# Patient Record
Sex: Female | Born: 1935 | Race: White | Hispanic: No | Marital: Married | Smoking: Former smoker
Health system: Southern US, Community
[De-identification: ages and names within clinical notes are randomized; demographics above are authoritative.]

## PROBLEM LIST (undated history)

## (undated) DIAGNOSIS — E785 Hyperlipidemia, unspecified: Secondary | ICD-10-CM

## (undated) DIAGNOSIS — C801 Malignant (primary) neoplasm, unspecified: Secondary | ICD-10-CM

## (undated) DIAGNOSIS — I10 Essential (primary) hypertension: Secondary | ICD-10-CM

## (undated) HISTORY — PX: ABDOMINAL HYSTERECTOMY: SHX81

## (undated) HISTORY — PX: CHOLECYSTECTOMY: SHX55

## (undated) HISTORY — DX: Hyperlipidemia, unspecified: E78.5

## (undated) HISTORY — PX: GALLBLADDER SURGERY: SHX652

---

## 1998-10-28 ENCOUNTER — Encounter: Payer: Self-pay | Admitting: Endocrinology

## 1998-10-28 ENCOUNTER — Ambulatory Visit (HOSPITAL_COMMUNITY): Admission: RE | Admit: 1998-10-28 | Discharge: 1998-10-28 | Payer: Self-pay | Admitting: Endocrinology

## 1999-10-06 ENCOUNTER — Other Ambulatory Visit: Admission: RE | Admit: 1999-10-06 | Discharge: 1999-10-06 | Payer: Self-pay | Admitting: Endocrinology

## 2000-01-06 ENCOUNTER — Encounter: Payer: Self-pay | Admitting: Endocrinology

## 2000-01-06 ENCOUNTER — Ambulatory Visit (HOSPITAL_COMMUNITY): Admission: RE | Admit: 2000-01-06 | Discharge: 2000-01-06 | Payer: Self-pay | Admitting: Endocrinology

## 2000-10-28 ENCOUNTER — Other Ambulatory Visit: Admission: RE | Admit: 2000-10-28 | Discharge: 2000-10-28 | Payer: Self-pay | Admitting: Endocrinology

## 2001-01-19 ENCOUNTER — Ambulatory Visit (HOSPITAL_COMMUNITY): Admission: RE | Admit: 2001-01-19 | Discharge: 2001-01-19 | Payer: Self-pay | Admitting: Endocrinology

## 2001-01-19 ENCOUNTER — Encounter: Payer: Self-pay | Admitting: Endocrinology

## 2002-03-02 ENCOUNTER — Encounter: Payer: Self-pay | Admitting: Endocrinology

## 2002-03-02 ENCOUNTER — Ambulatory Visit (HOSPITAL_COMMUNITY): Admission: RE | Admit: 2002-03-02 | Discharge: 2002-03-02 | Payer: Self-pay | Admitting: Endocrinology

## 2003-08-08 ENCOUNTER — Ambulatory Visit (HOSPITAL_COMMUNITY): Admission: RE | Admit: 2003-08-08 | Discharge: 2003-08-08 | Payer: Self-pay | Admitting: Endocrinology

## 2004-09-01 ENCOUNTER — Ambulatory Visit (HOSPITAL_COMMUNITY): Admission: RE | Admit: 2004-09-01 | Discharge: 2004-09-01 | Payer: Self-pay | Admitting: Endocrinology

## 2004-12-12 ENCOUNTER — Ambulatory Visit (HOSPITAL_COMMUNITY): Admission: RE | Admit: 2004-12-12 | Discharge: 2004-12-12 | Payer: Self-pay | Admitting: *Deleted

## 2004-12-12 ENCOUNTER — Encounter (INDEPENDENT_AMBULATORY_CARE_PROVIDER_SITE_OTHER): Payer: Self-pay | Admitting: Specialist

## 2004-12-26 ENCOUNTER — Emergency Department (HOSPITAL_COMMUNITY): Admission: EM | Admit: 2004-12-26 | Discharge: 2004-12-26 | Payer: Self-pay | Admitting: Emergency Medicine

## 2004-12-27 ENCOUNTER — Inpatient Hospital Stay (HOSPITAL_COMMUNITY): Admission: EM | Admit: 2004-12-27 | Discharge: 2004-12-30 | Payer: Self-pay | Admitting: Emergency Medicine

## 2004-12-28 ENCOUNTER — Encounter (INDEPENDENT_AMBULATORY_CARE_PROVIDER_SITE_OTHER): Payer: Self-pay | Admitting: Specialist

## 2004-12-28 ENCOUNTER — Ambulatory Visit: Payer: Self-pay | Admitting: Internal Medicine

## 2005-09-16 ENCOUNTER — Ambulatory Visit (HOSPITAL_COMMUNITY): Admission: RE | Admit: 2005-09-16 | Discharge: 2005-09-16 | Payer: Self-pay | Admitting: Endocrinology

## 2006-09-23 ENCOUNTER — Ambulatory Visit (HOSPITAL_COMMUNITY): Admission: RE | Admit: 2006-09-23 | Discharge: 2006-09-23 | Payer: Self-pay | Admitting: Endocrinology

## 2007-09-26 ENCOUNTER — Ambulatory Visit (HOSPITAL_COMMUNITY): Admission: RE | Admit: 2007-09-26 | Discharge: 2007-09-26 | Payer: Self-pay | Admitting: Endocrinology

## 2007-11-22 ENCOUNTER — Emergency Department (HOSPITAL_COMMUNITY): Admission: EM | Admit: 2007-11-22 | Discharge: 2007-11-22 | Payer: Self-pay | Admitting: Emergency Medicine

## 2008-12-03 ENCOUNTER — Ambulatory Visit (HOSPITAL_COMMUNITY): Admission: RE | Admit: 2008-12-03 | Discharge: 2008-12-03 | Payer: Self-pay | Admitting: Endocrinology

## 2010-04-02 ENCOUNTER — Ambulatory Visit (HOSPITAL_COMMUNITY): Admission: RE | Admit: 2010-04-02 | Discharge: 2010-04-02 | Payer: Self-pay | Admitting: Endocrinology

## 2011-02-06 NOTE — Op Note (Signed)
[name], [name]              ACCOUNT NO.:  [id number]   MEDICAL RECORD NO.:  [id number]          PATIENT TYPE:  INP   LOCATION:  0460                         FACILITY:  [facility]   PHYSICIAN:  Anselm Pancoast. Weatherly, M.D.DATE OF BIRTH:  Sep 23, 1934   DATE OF PROCEDURE:  12/28/2004  DATE OF DISCHARGE:                                 OPERATIVE REPORT   PREOPERATIVE DIAGNOSIS:  Chronic cholecystitis with abnormal liver function  studies.   POSTOPERATIVE DIAGNOSIS:  Chronic cholecystitis with stones and cystic duct  stone.   PROCEDURE:  Laparoscopic cholecystectomy with cholangiogram.   ANESTHESIA:  General.   SURGEON:  Anselm Pancoast. Zachery Dakins, M.D.   ASSISTANT:  Adolph Pollack, M.D.   INDICATIONS FOR PROCEDURE:  [name] is a 76 year old Caucasian female  who was admitted through the emergency room yesterday where she returned  about 24 hours after being seen in the emergency room Friday with epigastric  pain that occurred after eating some potato chips.  She has had some kind of  vague epigastric symptoms and had an upper endoscopy and a colonoscopy by  Dr. Virginia Rochester on the 24th of March.  Dr. Juleen China is her regular physician.  Then,  this pain became tense.  She is on amoxicillin now for positive H pylori.  She was treated with a GI cocktail and released.  The pain, however, came  back not too long afterwards, and she was seen by Dr. Ignacia Palma yesterday.  She had called Dr. Leone Payor, and he had recommended that she come to the  emergency room.  Dr. Ignacia Palma saw her and obtained an ultrasound which  showed a distended gallbladder with numerous stones.  The bile duct was 8  mm.  The liver tests were mildly abnormal, but after receiving a Dilaudid  shot, her pain subsided and she denies having any further pain when I  reexamined her this morning.  I recommend that we proceed on with a  laparoscopic cholecystectomy and cholangiogram, but I wonder if she has a  cystic duct stone that  may or may not have been passed.  The patient is in  agreement with this.   DESCRIPTION OF PROCEDURE:  She was given 3 g of Unasyn and PSA stockings and  taken to the operating suite.  Induction of anesthesia and a tracheal tube  oral tube into the stomach.  She has had a previous hysterectomy but no  upper abdominal surgery.   A small incision was made just below the umbilicus.  We kind of carefully  entered into the peritoneal cavity.  There were some adhesions up here, and  we kind of worked them around.  The upper 10-mm trocar was placed under  direct vision.  She had a very dilated gallbladder with numerous adhesions  around it.  The lateral 5-mm trocars were placed.  We could kind of grasp  the gallbladder, and then we carefully freed the adhesions around it.  The  proximal portion of the gallbladder was dissected free.  The little cystic  artery was identified, doubly clipped proximally, singly distally, and  divided, and then the cystic  duct was just packed with stones.  I placed a  clip on the cystic duct/gallbladder junction and then milked out these  numerous stones in the cystic duct.  We got good flow of bile.  The bile is  under a little extra pressure.  A Cholangiocath was inserted and then a clip  and the x-ray obtained.  The cystic duct was about 1.5 cm in length, and I  did not see any stones in it.  There was, however, a stone in the distal  common bile duct.  You could see the meniscus.  I think it would be best to  let her have an ERCP, so I did not try to do any stone manipulation of the  ampulla but quadrupily clipped the cystic duct and divided it.  The  gallbladder was freed up with electrocautery, with good hemostasis.  It was  placed in an EndoCatch bag.  The irrigation fluid was aspirated where we had  manipulated and had some bile spillage from the common bile duct or cystic  duct opening.  This was irrigated and aspirated and irrigated until  everything was  clear.  The gallbladder within the EndoCatch bag was then  brought out through the umbilicus after switching the camera to the upper 10-  mm port.  We reinserted this on a cannula in the umbilicus and reinspected  for irrigation fluid, bleeding.  Everything looked fine.  We then switched  the camera back up to the upper 10-mm port and withdrew the 5-mm ports.  I  placed an additional figure-of-eight suture in the fascia at the umbilicus  in addition to the 0 pursestring I had placed.  I tied both and anesthetized  the fascia.  Carbon dioxide was released.  The upper 10-mm trocar was  withdrawn, and we then closed the subcutaneous wounds with 4-0 Vicryl.  Benzoin and Steri-Strips were placed on the skin.   I talked with Dr. Leone Payor who actually talked with the patient yesterday  before she came to the emergency room, and he says that Dr. Virginia Rochester is back in  town tomorrow, but he will arrange for Dr. Virginia Rochester to do an ERCP or he will do  an ERCP, but we will wait until Monday.  I think this is certainly  reasonable, and the family was notified of this information.      WJW/MEDQ  D:  12/28/2004  T:  12/28/2004  Job:  [id number]   cc:   Georgiana Spinner, M.D.  [address]  [place]  [place] [address]  Fax: [phone]   Brooke Bonito, M.D.  [address]  [place]  [place] [address]  Fax: [phone]

## 2011-02-06 NOTE — H&P (Signed)
NAMEROGER, FASNACHT              ACCOUNT NO.:  [id number]   MEDICAL RECORD NO.:  [id number]          PATIENT TYPE:  INP   LOCATION:  0460                         FACILITY:  [facility]   PHYSICIAN:  Anselm Pancoast. Weatherly, M.D.DATE OF BIRTH:  November 02, 1934   DATE OF ADMISSION:  12/27/2004  DATE OF DISCHARGE:                                HISTORY & PHYSICAL   CHIEF COMPLAINT:  Epigastric pain secondary to gallstones.   HISTORY:  [name] is a 76 year old Caucasian female, patient of Dr.  Maurine Minister Kohut's, who presented to the emergency room about 24 hours ago with  severe epigastric pain.  She has a history of indigestion and epigastric  pain and was referred to Dr. Virginia Rochester who did an upper endoscopy and a  colonoscopy on I think December 12, 2004.  Her H. pylori was positive, and she  is presently on amoxicillin and the drugs for this.  When she was seen in  the emergency room on December 15, 2004, they gave her a GI cocktail and she  appeared to be improved, and shortly after this she was released.  Now 24  hours, she is having some more severe epigastric pain and she returned to  the emergency room, this time was seen by Dr. Carleene Cooper who sent her for  an ultrasound that confirmed multiple small stones within her gallbladder  and an 8 mm common bile duct.  He suggested that she see a Development worker, international aid.  I was on call and happened to be in the emergency room seeing other patients  at that time.  She said that after Dr. Ignacia Palma gave her the Dilaudid  injection her pain basically subsided and she was a couple of hours of later  at that time, but was not in any significant discomfort at that time.  In  the emergency room, her vital signs:  She had not been febrile, her  temperature was 97.5, pulse 88, respirations 16, blood pressure was 177/87.   PAST MEDICAL HISTORY:  1. History of mild hypertension.  2. Previous hysterectomy.     SOCIAL HISTORY:  She denies alcohol and is a nonsmoker.  She  works about  _______________.   CURRENT MEDICATIONS:  Besides the amoxicillin and the other medications.  1. _____________ 350 mg p.o. daily for blood pressure.  2. Multivitamin daily.     PAST SURGICAL HISTORY:  Abdominal hysterectomy.   REVIEW OF SYSTEMS:  Denies blood pressure problem with the exception of mild  hypotension which she said that Dr. Juleen China has been talking to her about her  cholesterol being elevated and has considered placing her on a medication  for this.  She denies pulmonary problems, denies endocrine problems, she has  a little problem in the past with hemorrhoids, her colonoscopy was otherwise  normal and denies problems with nutrition, sleeping, etc.   PHYSICAL EXAMINATION:  GENERAL:  She is a pleasant, mildly overweight,  Caucasian female who appears her stated age and not in any acute distress at  this time.  Her husband is present during the examination and history  taking.  VITAL SIGNS:  Temperature in the emergency room was 97.5, blood pressure is  177/87.  When  she actually got to the floor, her blood pressure was 153/74.  She is 5 feet 8 inches and weighs 205 pounds.  HEENT:  She is not icteric, well hydrated, no cervical or supraclavicular  lymphadenopathy.  No carotid bruits.  No supraclavicular nodes.  LUNGS:  Clear breath sounds bilaterally.  CARDIAC:  Normal sinus rhythm.  BREASTS:  Unremarkable.  ABDOMEN:  She is not acutely tender.  She has a lower midline incision.  I  do not appreciate any umbilical incision or groin hernias.  I did not do a  rectal examination, she has recently had a colonoscopy that was  unremarkable.  EXTREMITIES:  There is no pedal edema.  Good peripheral pulses.  NEUROLOGIC:  Physiologic.   LABORATORY DATA:  In the emergency room, her urinalysis is negative.  Her  liver function studies are mildly elevated.  Her bilirubin is 2.3, SGOT of  525, SGPT of 462, alkaline phosphatase of 114.  White count is only 5200,   hematocrit is 41.   PLAN:  I am going to plan to admit her, put her on intravenous fluids, and  re-assess her in the morning.  If she is having no further pain, I would  proceed on with an urgent laparoscopic cholecystectomy and cholangiogram,  and I have discussed with her about the possibility of needing an endoscopic  retrograde cholangiopancreatography if there is still stones in the common  bile duct.  Clinically, it appears that she has hopefully passed the stone  since her pain has definitely resolved.  Her common bile duct was only 8 mm  on examination with the ultrasound.  The patient is not allergic to  penicillin and will be given Unasyn on call to the operating room which has  been tentatively scheduled for Sunday morning.      WJW/MEDQ  D:  12/28/2004  T:  12/28/2004  Job:  [id number]

## 2011-02-06 NOTE — Op Note (Signed)
NAMEDECHELLE, [name] NO.:  [id number]   MEDICAL RECORD NO.:  [id number]          PATIENT TYPE:  INP   LOCATION:  0460                         FACILITY:  [facility]   PHYSICIAN:  Georgiana Spinner, M.D.    DATE OF BIRTH:  07/30/1935   DATE OF PROCEDURE:  12/29/2004  DATE OF DISCHARGE:                                 OPERATIVE REPORT   PROCEDURE:  ERCP with stone removal fundus and stent placement.   ANESTHESIA:  Demerol 130, Versed 13 mg, Robinul 0.2 mg and glucagon 1.5 mcg  were given.   DESCRIPTION OF PROCEDURE:  With the patient mildly sedated in the prone  position room 2 of radiology,  the Olympus videoscopic side-viewing  endoscope was inserted in the mouth, passed through the esophagus and the  stomach into the duodenum and was pulled into the shortened position and we  were able to see therefore the ampulla of Vater which was photographed.  There was a fair amount of duodenal spasm which we were partially able to  control with medications.  The loss was easy to enter, however, we injected  contrast on the first pass and outlined a normal pancreatic duct.  Subsequently on the next 2-3 passes with the guidewire continued to go into  the pancreatic duct. Therefore I elected to pass the guidewire into the  pancreatic duct and place a stent which we did. We placed a short stent with  one side being straight without flanges and the external flanges were  placed. Once were satisfied with the placement of the stent, pancreatic  guidewire was removed and we reintroduced the tritome catheter with the  guidewire. We were then able to cannulate the os and get the guide wire to  go to the common bile duct. There was some resistance to passage of the  tritome over the pancreatic stent but we were able to get this to go  subsequently and contrast was injected and a stone or filling defect was  noted in the  common bile duct. Subsequently then we pulled the tritome  catheter  back to where we could see the cutting wire and made a  sphincterotomy. Once this was done satisfactorily, the tritome catheter was  removed leaving the guidewire in place. Subsequently an 8.5 mm balloon  catheter was advanced over this wire into the proximal common bile duct.  It  was inflated and a pull through was made and it was noted that the stone had  come out which we photographed endoscopically. Once this had been  accomplished and the pancreatic duct stent was left in place, we terminated  the procedure, withdrew the endoscope. The patient's vital signs and pulse  oximeter remained stable. The patient tolerated the procedure well without  apparent complications.   FINDINGS:  Common bile duct stone removed and stent placed in the pancreatic  duct which should fall out in the next 24-48 hours.      GMO/MEDQ  D:  12/29/2004  T:  12/29/2004  Job:  [id number]   cc:   Anselm Pancoast. Zachery Dakins, M.D.  [address]  [address], [address]  [place]  [place] [address]

## 2011-02-06 NOTE — Op Note (Signed)
NAME:  [name], [name] NO.:  [id number]   MEDICAL RECORD NO.:  [id number]          PATIENT TYPE:  AMB   LOCATION:  ENDO                         FACILITY:  [facility]   PHYSICIAN:  Georgiana Spinner, M.D.    DATE OF BIRTH:  05/15/1935   DATE OF PROCEDURE:  DATE OF DISCHARGE:                                 OPERATIVE REPORT   PROCEDURE:  Upper endoscopy.   INDICATIONS:  Abdominal pain.   ANESTHESIA:  Demerol 60 mg, Versed 6 mg.   DESCRIPTION OF PROCEDURE:  With the patient mildly sedated in the left  lateral decubitus position, the Olympus videoscopic endoscope was inserted  in the mouth and passed under direct vision through the esophagus, which  appeared normal, into the stomach.  The  fundus, body, antrum, duodenal  bulb, and second portion of the duodenum all visualized.  From this point,  the endoscope was slowly withdrawn taking circumferential views of the  duodenal mucosa until the endoscope had been  pulled back into the stomach,  placed in retroflexion and viewed the stomach from below.  The endoscope was  then straightened, withdrawn, taking circumferential views of the remaining  gastric and esophageal mucosa, stopping to biopsy the antrum and body of the  stomach to rule out gastritis and distal-most part of the esophagus where  the squamocolumnar junction met to rule out Barrett's.  The patient's vital  signs and pulse oximetry remained stable.  The patient tolerated the  procedure well without apparent complication.   FINDINGS:  Gastritis.  Await biopsy report.  The patient will call me for  results and follow up with me as an outpatient.  Also await biopsy report of  the distal esophagus.      GMO/MEDQ  D:  12/12/2004  T:  12/12/2004  Job:  [id number]

## 2011-02-06 NOTE — Consult Note (Signed)
NAME:  [name], [name]              ACCOUNT NO.:  [id number]   MEDICAL RECORD NO.:  [id number]          PATIENT TYPE:  INP   LOCATION:  0460                         FACILITY:  [facility]   PHYSICIAN:  Iva Boop, M.D. LHCDATE OF BIRTH:  02-Jun-1935   DATE OF CONSULTATION:  DATE OF DISCHARGE:                                   CONSULTATION   GI CONSULT:  The patient is a patient of Dr. Wende Neighbors.  Dr. Alyse Low has  requested the consult.   REASON FOR CONSULTATION:  Choledocholithiasis.   HISTORY:  This is a 76 year old white woman that has recently had an upper  GI endoscopy and colonoscopy by Dr. Virginia Rochester which demonstrated diverticulosis of  the colon, and some gastritis and turned out to have H. pylori infection.  She was on a Prev-Pak.  She actually called me yesterday after she had eaten  potato chips the day before that.  She developed fairly persistent  epigastric and supraumbilical abdominal pain.  She went to the emergency  department and was given GI cocktail with modest benefit, if at all.  No  laboratory investigation was performed.  I recommended she return to the  emergency department where gallbladder ultrasound demonstrated multiple  small stones with an 8 mm common bile duct.  Her labs at that time were  notable for bilirubin 2.3, alkaline phosphatase 114, AST 525, ALT 462.  Her  lipase was normal.  She underwent a laparoscopic cholecystectomy today and  she was found to have stones in the gallbladder, intraoperative  cholangiogram demonstrates meniscus sign and obstruction of the dict without  contrast going into the bowel.  I have looked at that personally.  The dict  appears perhaps mildly dilated.  She feels somewhat better, though there is  a persistent epigastric discomfort plus she has incisional discomfort at  this time.   DRUG ALLERGIES:  None.   HOME MEDICATIONS:  1.  Prev-Pak.  2. Avandia versus Avalide.  It is not entirely clear at this      point.  She is on  Protonix IV and morphine p.r.n.  She received Unasyn      preoperatively.   PAST MEDICAL HISTORY:  1.  As above as far as her endoscopic evaluation.  2.  Hypertension.  3.  Degenerative joint disease.  4.  Status post hysterectomy many years ago.   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  She lives in [place] with her husband. No alchohol or tobacco.   FAMILY HISTORY:  Mother had a stroke.  Brother had cerebral aneurysm.   REVIEW OF SYSTEMS:  Hand and shoulder pain, she says she has hemorrhoids.  She wears reading glasses.  GI as above.  All other systems appear negative  at this time.   PHYSICAL EXAMINATION:  A pleasant, elderly white woman.  Blood pressure  157/53, pulse 61, respirations 18, temperature 97.   EYES:  Anicteric.  MOUTH:  Free of oral lesions.  NECK:  Supple, no mass.  CHEST:  Clear.  HEART:  S1, S2, no rubs, murmurs, gallops.  ABDOMEN:  Soft.  There is appropriate peri-incisional tenderness where she  is bandaged from her laparoscopic scars.  Bowel sounds are present.  There  is no mass.  EXTREMITIES:  Trace peripheral edema bilaterally.  NEUROLOGIC:  She is awake and alert and oriented x 3.  LYMPH NODES:  No neck or supraclavicular nodes palpated.  SKIN:  No acute rash.   ADDITIONAL LAB DATA:  Her CBC was normal.  Her UA was negative.   ASSESSMENT:  Choledocholithiasis after a laparoscopic cholecystectomy.   PLAN:  The patient will need an ERCP.  She is a patient of Dr. Virginia Rochester.  Will  plan for Dr. Virginia Rochester to carry this out.  We will discuss with him either tonight  or tomorrow at sign out.  I have explained the risks, benefits and  indications or ERCP to the patient.  I am going to go ahead and start Unasyn  1.5 g every six hours at this point.  This will cover for cholangitis.      CEG/MEDQ  D:  12/28/2004  T:  12/28/2004  Job:  [id number]   cc:   Georgiana Spinner, M.D.  [address]  [place]  [place] [address]   Fax: [phone]   Anselm Pancoast. Zachery Dakins, M.D.  [address]  [place]  [place] [address]

## 2011-02-06 NOTE — Discharge Summary (Signed)
NAMERAVYN, NIKKEL              ACCOUNT NO.:  [id number]   MEDICAL RECORD NO.:  [id number]          PATIENT TYPE:  INP   LOCATION:  0460                         FACILITY:  [facility]   PHYSICIAN:  Anselm Pancoast. Weatherly, M.D.DATE OF BIRTH:  1935/05/05   DATE OF ADMISSION:  12/27/2004  DATE OF DISCHARGE:                                 DISCHARGE SUMMARY   DISCHARGING DIAGNOSES:  1.  Subacute cholecystitis with stones and common duct stone.  2.  History of mild hypertension.   OPERATION:  Laparoscopic cholecystectomy, and ERCP and sphincterotomy and  common duct stone removal - Dr. Sabino Gasser.   HISTORY:  [name] is a 76 year old female patient of Dr. Adela Lank  who presented to the emergency room 24 hours prior to this visit with severe  epigastric pain. She has a history of indigestion and epigastric pain and  was seen by Dr. Virginia Rochester in March who did an upper endoscopy and colonoscopy. He  placed her on amoxicillin since her H. pylori was positive even though no  definite ulcer was noted, and then she had severe episode of pain on Friday  after eating, came to the emergency room, and was given a GI cocktail and  then released. She returned home but shortly afterwards continued to have  the pain, returned to the emergency room, this time was seen by Dr.  Ignacia Palma. He got an ultrasound that showed a lot of stones and a distended  gallbladder. I was asked to see the patient and on examination her pain was  definitely less after she had received the Dilaudid. Her liver function  studies, however, were abnormal and the patient stated that the previous  laboratory studies had not been abnormal. This was last on Saturday  afternoon and I put her in, said we would reassess her first thing in the  morning, and decide whether we needed to proceed with a laparoscopic  cholecystectomy and cholangiogram or whether an ERCP followed by  cholangiogram. The following morning she said she had had  very little pain  during the night and I proceeded on to the laparoscopic cholecystectomy. Dr.  Abbey Chatters assisted. She did have a subacutely inflamed gallbladder, a lot  of stones in the gallbladder, but also a stone in the common bile duct  distally and postoperatively she was seen by Dr. Stan Head and his P.A.  and they made arrangements for Dr. Virginia Rochester to do an ERCP and sphincterotomy on  Monday morning. That procedure was tolerated nicely and Dr. Virginia Rochester did place a  stent in the pancreatic duct which he states will come out on its own, and  the patient is now basically asymptomatic the following morning. I think she  can be released and followed up in our office in 10-14 days. We will have  Dr. Wende Neighbors office contacted if he needs to see her postoperatively also. The  laboratory studies were not rechecked this morning but clinically she is  certainly not acting as if she has any pancreatitis. Her admission bilirubin  was 2.3, yesterday it was 1.5, and clinically she is not icteric.  She was taking amoxicillin for this H. pylori which she will complete, has  Vicodin for pain, and resume her antihypertensive medications. She will be  on a lot of liquids for the next couple of days and then basically a select  diet, and then follow up as noted above.      WJW/MEDQ  D:  12/30/2004  T:  12/30/2004  Job:  [id number]   cc:   Georgiana Spinner, M.D.  [address]  [place]  [place] [address]  Fax: [phone]   Brooke Bonito, M.D.  [address]  [place]  [place] [address]  Fax: [phone]

## 2011-02-06 NOTE — Op Note (Signed)
NAME:  [name], SPAN NO.:  [id number]   MEDICAL RECORD NO.:  [id number]          PATIENT TYPE:  AMB   LOCATION:  ENDO                         FACILITY:  [facility]   PHYSICIAN:  Georgiana Spinner, M.D.    DATE OF BIRTH:  05-31-1935   DATE OF PROCEDURE:  12/12/2004  DATE OF DISCHARGE:                                 OPERATIVE REPORT   PROCEDURE:  Colonoscopy.   INDICATIONS:  Colon cancer screening.   ANESTHESIA:  Demerol 20, Versed 2 mg.   PROCEDURE:  With the patient mildly sedated in the left lateral decubitus  position, subsequently rolled to her back and partially to her right side,  the Olympus videoscopic colonoscope was inserted in the rectum and passed  under direct vision with pressure applied.  We reached the cecum, identified  by ileocecal valve and appendiceal orifice, both which were photographed.  From this point, the colonoscope was slowly withdrawn, taking  circumferential views of the colonic mucosa, stopping only in the rectum  which appeared normal on direct and retroflexed view.  The endoscope was  straightened and withdrawn.  The patient's vital signs and pulse oximetry  remained stable.  The patient tolerated procedure well without apparent  complications.   FINDINGS:  Some diverticulosis of sigmoid colon, otherwise an unremarkable  examination.   PLAN:  See endoscopy note for further details.      GMO/MEDQ  D:  12/12/2004  T:  12/12/2004  Job:  [id number]

## 2011-03-02 ENCOUNTER — Other Ambulatory Visit (HOSPITAL_COMMUNITY): Payer: Self-pay | Admitting: Endocrinology

## 2011-03-02 DIAGNOSIS — Z1231 Encounter for screening mammogram for malignant neoplasm of breast: Secondary | ICD-10-CM

## 2011-04-06 ENCOUNTER — Ambulatory Visit (HOSPITAL_COMMUNITY)
Admission: RE | Admit: 2011-04-06 | Discharge: 2011-04-06 | Disposition: A | Discharge status: Home / Self Care | Payer: Medicare Other | Source: Ambulatory Visit | Attending: Endocrinology | Admitting: Endocrinology

## 2011-04-06 DIAGNOSIS — Z1231 Encounter for screening mammogram for malignant neoplasm of breast: Secondary | ICD-10-CM | POA: Insufficient documentation

## 2011-06-15 LAB — URINALYSIS, ROUTINE W REFLEX MICROSCOPIC
Glucose, UA: NEGATIVE
Nitrite: POSITIVE — AB
Protein, ur: 100 — AB
Specific Gravity, Urine: 1.011
Urobilinogen, UA: 1
pH: 6

## 2011-06-15 LAB — URINE MICROSCOPIC-ADD ON

## 2012-03-01 ENCOUNTER — Other Ambulatory Visit (HOSPITAL_COMMUNITY): Payer: Self-pay | Admitting: Endocrinology

## 2012-03-01 DIAGNOSIS — Z1231 Encounter for screening mammogram for malignant neoplasm of breast: Secondary | ICD-10-CM

## 2012-04-08 ENCOUNTER — Ambulatory Visit (HOSPITAL_COMMUNITY)
Admission: RE | Admit: 2012-04-08 | Discharge: 2012-04-08 | Disposition: A | Discharge status: Home / Self Care | Payer: Medicare Other | Source: Ambulatory Visit | Attending: Endocrinology | Admitting: Endocrinology

## 2012-04-08 DIAGNOSIS — Z1231 Encounter for screening mammogram for malignant neoplasm of breast: Secondary | ICD-10-CM | POA: Insufficient documentation

## 2013-03-13 ENCOUNTER — Other Ambulatory Visit (HOSPITAL_COMMUNITY): Payer: Self-pay | Admitting: Endocrinology

## 2013-03-13 DIAGNOSIS — Z1231 Encounter for screening mammogram for malignant neoplasm of breast: Secondary | ICD-10-CM

## 2013-04-13 ENCOUNTER — Ambulatory Visit (HOSPITAL_COMMUNITY)
Admission: RE | Admit: 2013-04-13 | Discharge: 2013-04-13 | Disposition: A | Discharge status: Home / Self Care | Payer: Medicare Other | Source: Ambulatory Visit | Attending: Endocrinology | Admitting: Endocrinology

## 2013-04-13 DIAGNOSIS — Z1231 Encounter for screening mammogram for malignant neoplasm of breast: Secondary | ICD-10-CM

## 2014-03-13 ENCOUNTER — Other Ambulatory Visit (HOSPITAL_COMMUNITY): Payer: Self-pay | Admitting: Endocrinology

## 2014-03-13 DIAGNOSIS — Z1231 Encounter for screening mammogram for malignant neoplasm of breast: Secondary | ICD-10-CM

## 2014-04-16 ENCOUNTER — Ambulatory Visit (HOSPITAL_COMMUNITY)
Admission: RE | Admit: 2014-04-16 | Discharge: 2014-04-16 | Disposition: A | Discharge status: Home / Self Care | Payer: Medicare Other | Source: Ambulatory Visit | Attending: Endocrinology | Admitting: Endocrinology

## 2014-04-16 DIAGNOSIS — Z1231 Encounter for screening mammogram for malignant neoplasm of breast: Secondary | ICD-10-CM | POA: Diagnosis present

## 2015-04-09 ENCOUNTER — Other Ambulatory Visit (HOSPITAL_COMMUNITY): Payer: Self-pay | Admitting: Endocrinology

## 2015-04-09 DIAGNOSIS — Z1231 Encounter for screening mammogram for malignant neoplasm of breast: Secondary | ICD-10-CM

## 2015-04-23 ENCOUNTER — Ambulatory Visit (HOSPITAL_COMMUNITY)
Admission: RE | Admit: 2015-04-23 | Discharge: 2015-04-23 | Disposition: A | Discharge status: Home / Self Care | Payer: PPO | Source: Ambulatory Visit | Attending: Endocrinology | Admitting: Endocrinology

## 2015-04-23 DIAGNOSIS — Z1231 Encounter for screening mammogram for malignant neoplasm of breast: Secondary | ICD-10-CM | POA: Diagnosis not present

## 2015-09-27 DIAGNOSIS — H5211 Myopia, right eye: Secondary | ICD-10-CM | POA: Diagnosis not present

## 2015-09-27 DIAGNOSIS — H25012 Cortical age-related cataract, left eye: Secondary | ICD-10-CM | POA: Diagnosis not present

## 2015-09-27 DIAGNOSIS — H25042 Posterior subcapsular polar age-related cataract, left eye: Secondary | ICD-10-CM | POA: Diagnosis not present

## 2015-09-27 DIAGNOSIS — H2512 Age-related nuclear cataract, left eye: Secondary | ICD-10-CM | POA: Diagnosis not present

## 2015-09-27 DIAGNOSIS — Z961 Presence of intraocular lens: Secondary | ICD-10-CM | POA: Diagnosis not present

## 2015-09-27 DIAGNOSIS — H2511 Age-related nuclear cataract, right eye: Secondary | ICD-10-CM | POA: Diagnosis not present

## 2015-09-27 DIAGNOSIS — H5201 Hypermetropia, right eye: Secondary | ICD-10-CM | POA: Diagnosis not present

## 2015-09-27 DIAGNOSIS — H25811 Combined forms of age-related cataract, right eye: Secondary | ICD-10-CM | POA: Diagnosis not present

## 2015-09-27 DIAGNOSIS — H52221 Regular astigmatism, right eye: Secondary | ICD-10-CM | POA: Diagnosis not present

## 2015-09-28 DIAGNOSIS — H52221 Regular astigmatism, right eye: Secondary | ICD-10-CM | POA: Diagnosis not present

## 2015-09-28 DIAGNOSIS — H5201 Hypermetropia, right eye: Secondary | ICD-10-CM | POA: Diagnosis not present

## 2015-09-28 DIAGNOSIS — Z961 Presence of intraocular lens: Secondary | ICD-10-CM | POA: Diagnosis not present

## 2015-10-11 DIAGNOSIS — H524 Presbyopia: Secondary | ICD-10-CM | POA: Diagnosis not present

## 2015-10-11 DIAGNOSIS — H52221 Regular astigmatism, right eye: Secondary | ICD-10-CM | POA: Diagnosis not present

## 2015-10-11 DIAGNOSIS — H2512 Age-related nuclear cataract, left eye: Secondary | ICD-10-CM | POA: Diagnosis not present

## 2015-10-11 DIAGNOSIS — H5212 Myopia, left eye: Secondary | ICD-10-CM | POA: Diagnosis not present

## 2015-10-11 DIAGNOSIS — Z961 Presence of intraocular lens: Secondary | ICD-10-CM | POA: Diagnosis not present

## 2015-10-11 DIAGNOSIS — H25812 Combined forms of age-related cataract, left eye: Secondary | ICD-10-CM | POA: Diagnosis not present

## 2015-10-18 DIAGNOSIS — H5212 Myopia, left eye: Secondary | ICD-10-CM | POA: Diagnosis not present

## 2015-10-18 DIAGNOSIS — Z961 Presence of intraocular lens: Secondary | ICD-10-CM | POA: Diagnosis not present

## 2015-10-30 DIAGNOSIS — I10 Essential (primary) hypertension: Secondary | ICD-10-CM | POA: Diagnosis not present

## 2015-10-30 DIAGNOSIS — E789 Disorder of lipoprotein metabolism, unspecified: Secondary | ICD-10-CM | POA: Diagnosis not present

## 2015-11-27 DIAGNOSIS — F419 Anxiety disorder, unspecified: Secondary | ICD-10-CM | POA: Diagnosis not present

## 2015-11-27 DIAGNOSIS — I10 Essential (primary) hypertension: Secondary | ICD-10-CM | POA: Diagnosis not present

## 2015-11-27 DIAGNOSIS — R918 Other nonspecific abnormal finding of lung field: Secondary | ICD-10-CM | POA: Diagnosis not present

## 2015-11-27 DIAGNOSIS — E789 Disorder of lipoprotein metabolism, unspecified: Secondary | ICD-10-CM | POA: Diagnosis not present

## 2016-01-28 DIAGNOSIS — E789 Disorder of lipoprotein metabolism, unspecified: Secondary | ICD-10-CM | POA: Diagnosis not present

## 2016-01-28 DIAGNOSIS — L820 Inflamed seborrheic keratosis: Secondary | ICD-10-CM | POA: Diagnosis not present

## 2016-07-08 ENCOUNTER — Other Ambulatory Visit: Payer: Self-pay | Admitting: Endocrinology

## 2016-07-08 DIAGNOSIS — Z1231 Encounter for screening mammogram for malignant neoplasm of breast: Secondary | ICD-10-CM

## 2016-07-21 ENCOUNTER — Ambulatory Visit
Admission: RE | Admit: 2016-07-21 | Discharge: 2016-07-21 | Disposition: A | Discharge status: Home / Self Care | Payer: PPO | Source: Ambulatory Visit | Attending: Endocrinology | Admitting: Endocrinology

## 2016-07-21 DIAGNOSIS — Z1231 Encounter for screening mammogram for malignant neoplasm of breast: Secondary | ICD-10-CM

## 2016-08-24 DIAGNOSIS — L821 Other seborrheic keratosis: Secondary | ICD-10-CM | POA: Diagnosis not present

## 2016-08-24 DIAGNOSIS — L708 Other acne: Secondary | ICD-10-CM | POA: Diagnosis not present

## 2016-11-05 DIAGNOSIS — I10 Essential (primary) hypertension: Secondary | ICD-10-CM | POA: Diagnosis not present

## 2016-11-05 DIAGNOSIS — Z0000 Encounter for general adult medical examination without abnormal findings: Secondary | ICD-10-CM | POA: Diagnosis not present

## 2016-11-05 DIAGNOSIS — E789 Disorder of lipoprotein metabolism, unspecified: Secondary | ICD-10-CM | POA: Diagnosis not present

## 2016-12-10 DIAGNOSIS — Z0000 Encounter for general adult medical examination without abnormal findings: Secondary | ICD-10-CM | POA: Diagnosis not present

## 2016-12-10 DIAGNOSIS — I10 Essential (primary) hypertension: Secondary | ICD-10-CM | POA: Diagnosis not present

## 2016-12-10 DIAGNOSIS — M25569 Pain in unspecified knee: Secondary | ICD-10-CM | POA: Diagnosis not present

## 2016-12-10 DIAGNOSIS — R899 Unspecified abnormal finding in specimens from other organs, systems and tissues: Secondary | ICD-10-CM | POA: Diagnosis not present

## 2016-12-10 DIAGNOSIS — G912 (Idiopathic) normal pressure hydrocephalus: Secondary | ICD-10-CM | POA: Diagnosis not present

## 2016-12-10 DIAGNOSIS — E789 Disorder of lipoprotein metabolism, unspecified: Secondary | ICD-10-CM | POA: Diagnosis not present

## 2017-01-04 DIAGNOSIS — H5203 Hypermetropia, bilateral: Secondary | ICD-10-CM | POA: Diagnosis not present

## 2017-01-12 DIAGNOSIS — M25561 Pain in right knee: Secondary | ICD-10-CM | POA: Diagnosis not present

## 2017-01-12 DIAGNOSIS — M25562 Pain in left knee: Secondary | ICD-10-CM | POA: Diagnosis not present

## 2017-01-12 DIAGNOSIS — M170 Bilateral primary osteoarthritis of knee: Secondary | ICD-10-CM | POA: Diagnosis not present

## 2017-01-19 DIAGNOSIS — M25561 Pain in right knee: Secondary | ICD-10-CM | POA: Diagnosis not present

## 2017-01-19 DIAGNOSIS — M170 Bilateral primary osteoarthritis of knee: Secondary | ICD-10-CM | POA: Diagnosis not present

## 2017-01-19 DIAGNOSIS — M25562 Pain in left knee: Secondary | ICD-10-CM | POA: Diagnosis not present

## 2017-01-26 DIAGNOSIS — M170 Bilateral primary osteoarthritis of knee: Secondary | ICD-10-CM | POA: Diagnosis not present

## 2017-01-26 DIAGNOSIS — M25562 Pain in left knee: Secondary | ICD-10-CM | POA: Diagnosis not present

## 2017-01-26 DIAGNOSIS — M25561 Pain in right knee: Secondary | ICD-10-CM | POA: Diagnosis not present

## 2017-02-02 DIAGNOSIS — M25562 Pain in left knee: Secondary | ICD-10-CM | POA: Diagnosis not present

## 2017-02-02 DIAGNOSIS — M25561 Pain in right knee: Secondary | ICD-10-CM | POA: Diagnosis not present

## 2017-02-02 DIAGNOSIS — M170 Bilateral primary osteoarthritis of knee: Secondary | ICD-10-CM | POA: Diagnosis not present

## 2017-02-02 DIAGNOSIS — G912 (Idiopathic) normal pressure hydrocephalus: Secondary | ICD-10-CM | POA: Diagnosis not present

## 2017-02-09 DIAGNOSIS — M25561 Pain in right knee: Secondary | ICD-10-CM | POA: Diagnosis not present

## 2017-02-09 DIAGNOSIS — R899 Unspecified abnormal finding in specimens from other organs, systems and tissues: Secondary | ICD-10-CM | POA: Diagnosis not present

## 2017-02-09 DIAGNOSIS — E789 Disorder of lipoprotein metabolism, unspecified: Secondary | ICD-10-CM | POA: Diagnosis not present

## 2017-02-09 DIAGNOSIS — I10 Essential (primary) hypertension: Secondary | ICD-10-CM | POA: Diagnosis not present

## 2017-02-09 DIAGNOSIS — M25562 Pain in left knee: Secondary | ICD-10-CM | POA: Diagnosis not present

## 2017-02-09 DIAGNOSIS — M170 Bilateral primary osteoarthritis of knee: Secondary | ICD-10-CM | POA: Diagnosis not present

## 2017-05-12 DIAGNOSIS — M1712 Unilateral primary osteoarthritis, left knee: Secondary | ICD-10-CM | POA: Diagnosis not present

## 2017-05-12 DIAGNOSIS — M170 Bilateral primary osteoarthritis of knee: Secondary | ICD-10-CM | POA: Diagnosis not present

## 2017-05-12 DIAGNOSIS — M25562 Pain in left knee: Secondary | ICD-10-CM | POA: Diagnosis not present

## 2017-05-12 DIAGNOSIS — M25561 Pain in right knee: Secondary | ICD-10-CM | POA: Diagnosis not present

## 2017-06-10 ENCOUNTER — Other Ambulatory Visit: Payer: Self-pay | Admitting: Internal Medicine

## 2017-06-10 DIAGNOSIS — Z1231 Encounter for screening mammogram for malignant neoplasm of breast: Secondary | ICD-10-CM

## 2017-07-26 ENCOUNTER — Ambulatory Visit
Admission: RE | Admit: 2017-07-26 | Discharge: 2017-07-26 | Disposition: A | Discharge status: Home / Self Care | Payer: PPO | Source: Ambulatory Visit | Attending: Internal Medicine | Admitting: Internal Medicine

## 2017-07-26 DIAGNOSIS — Z1231 Encounter for screening mammogram for malignant neoplasm of breast: Secondary | ICD-10-CM | POA: Diagnosis not present

## 2017-08-05 DIAGNOSIS — I10 Essential (primary) hypertension: Secondary | ICD-10-CM | POA: Diagnosis not present

## 2017-08-05 DIAGNOSIS — M109 Gout, unspecified: Secondary | ICD-10-CM | POA: Diagnosis not present

## 2017-08-05 DIAGNOSIS — E785 Hyperlipidemia, unspecified: Secondary | ICD-10-CM | POA: Diagnosis not present

## 2017-08-06 DIAGNOSIS — I10 Essential (primary) hypertension: Secondary | ICD-10-CM | POA: Diagnosis not present

## 2017-08-23 DIAGNOSIS — I10 Essential (primary) hypertension: Secondary | ICD-10-CM | POA: Diagnosis not present

## 2017-11-18 DIAGNOSIS — E785 Hyperlipidemia, unspecified: Secondary | ICD-10-CM | POA: Diagnosis not present

## 2017-11-18 DIAGNOSIS — Z0000 Encounter for general adult medical examination without abnormal findings: Secondary | ICD-10-CM | POA: Diagnosis not present

## 2017-11-18 DIAGNOSIS — N390 Urinary tract infection, site not specified: Secondary | ICD-10-CM | POA: Diagnosis not present

## 2017-11-18 DIAGNOSIS — I10 Essential (primary) hypertension: Secondary | ICD-10-CM | POA: Diagnosis not present

## 2017-11-25 DIAGNOSIS — Z0000 Encounter for general adult medical examination without abnormal findings: Secondary | ICD-10-CM | POA: Diagnosis not present

## 2017-11-25 DIAGNOSIS — E7800 Pure hypercholesterolemia, unspecified: Secondary | ICD-10-CM | POA: Diagnosis not present

## 2017-11-25 DIAGNOSIS — I10 Essential (primary) hypertension: Secondary | ICD-10-CM | POA: Diagnosis not present

## 2017-11-25 DIAGNOSIS — E559 Vitamin D deficiency, unspecified: Secondary | ICD-10-CM | POA: Diagnosis not present

## 2017-12-01 DIAGNOSIS — Z1212 Encounter for screening for malignant neoplasm of rectum: Secondary | ICD-10-CM | POA: Diagnosis not present

## 2017-12-01 DIAGNOSIS — Z1211 Encounter for screening for malignant neoplasm of colon: Secondary | ICD-10-CM | POA: Diagnosis not present

## 2018-02-01 DIAGNOSIS — L821 Other seborrheic keratosis: Secondary | ICD-10-CM | POA: Diagnosis not present

## 2018-02-01 DIAGNOSIS — L814 Other melanin hyperpigmentation: Secondary | ICD-10-CM | POA: Diagnosis not present

## 2018-02-01 DIAGNOSIS — D1801 Hemangioma of skin and subcutaneous tissue: Secondary | ICD-10-CM | POA: Diagnosis not present

## 2018-02-01 DIAGNOSIS — L819 Disorder of pigmentation, unspecified: Secondary | ICD-10-CM | POA: Diagnosis not present

## 2018-02-01 DIAGNOSIS — L570 Actinic keratosis: Secondary | ICD-10-CM | POA: Diagnosis not present

## 2018-02-05 ENCOUNTER — Emergency Department (HOSPITAL_COMMUNITY)
Admission: EM | Admit: 2018-02-05 | Discharge: 2018-02-05 | Disposition: A | Discharge status: Home / Self Care | Payer: PPO | Attending: Emergency Medicine | Admitting: Emergency Medicine

## 2018-02-05 ENCOUNTER — Encounter (HOSPITAL_COMMUNITY): Payer: Self-pay | Admitting: Emergency Medicine

## 2018-02-05 ENCOUNTER — Emergency Department (HOSPITAL_COMMUNITY): Payer: PPO

## 2018-02-05 DIAGNOSIS — Z85820 Personal history of malignant melanoma of skin: Secondary | ICD-10-CM | POA: Diagnosis not present

## 2018-02-05 DIAGNOSIS — W01198A Fall on same level from slipping, tripping and stumbling with subsequent striking against other object, initial encounter: Secondary | ICD-10-CM | POA: Insufficient documentation

## 2018-02-05 DIAGNOSIS — Y929 Unspecified place or not applicable: Secondary | ICD-10-CM | POA: Diagnosis not present

## 2018-02-05 DIAGNOSIS — I10 Essential (primary) hypertension: Secondary | ICD-10-CM | POA: Insufficient documentation

## 2018-02-05 DIAGNOSIS — S0003XA Contusion of scalp, initial encounter: Secondary | ICD-10-CM

## 2018-02-05 DIAGNOSIS — Z87891 Personal history of nicotine dependence: Secondary | ICD-10-CM | POA: Diagnosis not present

## 2018-02-05 DIAGNOSIS — Y999 Unspecified external cause status: Secondary | ICD-10-CM | POA: Insufficient documentation

## 2018-02-05 DIAGNOSIS — S0990XA Unspecified injury of head, initial encounter: Secondary | ICD-10-CM | POA: Diagnosis not present

## 2018-02-05 DIAGNOSIS — M542 Cervicalgia: Secondary | ICD-10-CM | POA: Diagnosis not present

## 2018-02-05 DIAGNOSIS — Y939 Activity, unspecified: Secondary | ICD-10-CM | POA: Diagnosis not present

## 2018-02-05 DIAGNOSIS — S199XXA Unspecified injury of neck, initial encounter: Secondary | ICD-10-CM | POA: Diagnosis not present

## 2018-02-05 HISTORY — DX: Malignant (primary) neoplasm, unspecified: C80.1

## 2018-02-05 HISTORY — DX: Essential (primary) hypertension: I10

## 2018-02-05 LAB — I-STAT CHEM 8, ED
BUN: 18 mg/dL (ref 6–20)
Calcium, Ion: 1.26 mmol/L (ref 1.15–1.40)
Chloride: 106 mmol/L (ref 101–111)
Creatinine, Ser: 0.8 mg/dL (ref 0.44–1.00)
Glucose, Bld: 123 mg/dL — ABNORMAL HIGH (ref 65–99)
HCT: 40 % (ref 36.0–46.0)
Hemoglobin: 13.6 g/dL (ref 12.0–15.0)
Potassium: 3.9 mmol/L (ref 3.5–5.1)
Sodium: 143 mmol/L (ref 135–145)
TCO2: 26 mmol/L (ref 22–32)

## 2018-02-05 MED ORDER — ACETAMINOPHEN 500 MG PO TABS
1000.0000 mg | ORAL_TABLET | Freq: Once | ORAL | Status: AC
  Administered 2018-02-05: 1000 mg via ORAL
  Filled 2018-02-05: qty 2

## 2018-02-05 MED ORDER — METOPROLOL TARTRATE 25 MG PO TABS
50.0000 mg | ORAL_TABLET | Freq: Once | ORAL | Status: AC
  Administered 2018-02-05: 50 mg via ORAL
  Filled 2018-02-05: qty 2

## 2018-02-05 NOTE — ED Notes (Signed)
EDP at bedside updating patient on new POC

## 2018-02-05 NOTE — ED Triage Notes (Signed)
Patient presents to ED for assessment after a slip and fall backwards out of her son's car while trying to get in today.  Patient denies LOc.  C/o large bump to left posterior head.  Axo.  Pupils reactive, 37mm difference.

## 2018-02-05 NOTE — Discharge Instructions (Addendum)
Take tylenol as needed for pain, use ice as needed. See a clinician or come to the ER for persistent vomiting, confusion, neurologic concerns are new findings. You need to have your blood pressure rechecked and adjusted Monday by primary doctor.  Hold your metoprolol as your hr is running low.  Discuss with your doctor.   If you were given medicines take as directed.  If you are on coumadin or contraceptives realize their levels and effectiveness is altered by many different medicines.  If you have any reaction (rash, tongues swelling, other) to the medicines stop taking and see a physician.    If your blood pressure was elevated in the ER make sure you follow up for management with a primary doctor or return for chest pain, shortness of breath or stroke symptoms.  Please follow up as directed and return to the ER or see a physician for new or worsening symptoms.  Thank you. Vitals:   02/05/18 1257 02/05/18 1436 02/05/18 1707 02/05/18 1748  BP: (!) 251/89 (!) 249/76 (!) 247/86   Pulse: (!) 57 (!) 57 (!) 58 (!) 50  Resp: 18 16 18    Temp:   97.7 F (36.5 C)   TempSrc:   Oral   SpO2: 100% 100% 100%

## 2018-02-05 NOTE — ED Notes (Signed)
Dr. Reather Converse made aware of BP and patient's continued request to be discharged and go home

## 2018-02-05 NOTE — ED Notes (Signed)
Due to patient's BP will upgrade acutiy.  Patient states hx of anxiety, and had a panic attack this morning.  Patient states she did take all of her medicines this morning.

## 2018-02-05 NOTE — ED Notes (Signed)
Results reviewed.  No changes in acuity at this time 

## 2018-02-05 NOTE — ED Provider Notes (Addendum)
[facility] EMERGENCY DEPARTMENT Provider Note   CSN: [id number] Arrival date & time: 02/05/18  1057     History   Chief Complaint Chief Complaint  Patient presents with  . Fall  . Head Injury    HPI [name] is a 83 y.o. female.  Patient presents afteread injury that occurred earlier today. Patient slipped and fell backwards hitting the back of her head on her son's car without loss of consciousness. Patient is on baby aspirin. Patient thinks she took her blood pressure meds this morning. Patient denies neurologic symptoms. Headache and pain at the site. No other injuries. No chest pain or shortness of breath today.     Past Medical History:  Diagnosis Date  . Cancer ([place])    skin  . Hypertension     There are no active problems to display for this patient.   Past Surgical History:  Procedure Laterality Date  . ABDOMINAL HYSTERECTOMY    . CHOLECYSTECTOMY       OB History   None      Home Medications    Prior to Admission medications   Not on File    Family History Family History  Problem Relation Age of Onset  . Breast cancer Son     Social History Social History   Tobacco Use  . Smoking status: Former Research scientist (life sciences)  . Smokeless tobacco: Never Used  Substance Use Topics  . Alcohol use: Never    Frequency: Never  . Drug use: Never     Allergies   Shellfish allergy   Review of Systems Review of Systems  Constitutional: Negative for chills and fever.  HENT: Negative for congestion.   Eyes: Negative for visual disturbance.  Respiratory: Negative for shortness of breath.   Cardiovascular: Negative for chest pain.  Gastrointestinal: Negative for abdominal pain and vomiting.  Genitourinary: Negative for dysuria and flank pain.  Musculoskeletal: Negative for back pain, neck pain and neck stiffness.  Skin: Positive for wound. Negative for rash.  Neurological: Positive for headaches. Negative for light-headedness.      Physical Exam Updated Vital Signs BP (!) 130/104   Pulse (!) 53   Temp 97.7 F (36.5 C) (Oral)   Resp 18   SpO2 99%   Physical Exam  Constitutional: She is oriented to person, place, and time. She appears well-developed and well-nourished.  HENT:  Head: Normocephalic.  Mild hematoma left posterior scalp without laceration or defect. No midline cervical tenderness. Full range of motion and neck without pain.  Eyes: Conjunctivae are normal. Right eye exhibits no discharge. Left eye exhibits no discharge.  Neck: Normal range of motion. Neck supple. No tracheal deviation present.  Cardiovascular: Regular rhythm.  Pulmonary/Chest: Effort normal and breath sounds normal.  Abdominal: Soft. She exhibits no distension. There is no tenderness. There is no guarding.  Musculoskeletal: She exhibits tenderness. She exhibits no edema.  Patient has no tenderness with range of motion of upper and lower extremities.  Neurological: She is alert and oriented to person, place, and time. No cranial nerve deficit.  Skin: Skin is warm. No rash noted.  Psychiatric: Her mood appears anxious.  Nursing note and vitals reviewed.    ED Treatments / Results  Labs (all labs ordered are listed, but only abnormal results are displayed) Labs Reviewed  I-STAT CHEM 8, ED - Abnormal; Notable for the following components:      Result Value   Glucose, Bld 123 (*)    All other components  within normal limits    EKG None Patient EKG reviewed heart rate 62, no acute ST elevation, normal QT, sinus. Radiology Ct Head Wo Contrast  Result Date: 02/05/2018 CLINICAL DATA:  83 year old female with acute head and neck pain following fall and injury. Initial encounter. EXAM: CT HEAD WITHOUT CONTRAST CT CERVICAL SPINE WITHOUT CONTRAST TECHNIQUE: Multidetector CT imaging of the head and cervical spine was performed following the standard protocol without intravenous contrast. Multiplanar CT image reconstructions of  the cervical spine were also generated. COMPARISON:  None. FINDINGS: CT HEAD FINDINGS Brain: No evidence of acute infarction, hemorrhage, hydrocephalus, extra-axial collection or mass lesion/mass effect. Mild probable chronic small-vessel white matter ischemic changes noted. Vascular: Atherosclerotic calcifications noted. Skull: Normal. Negative for fracture or focal lesion. Sinuses/Orbits: No acute abnormality. A very small amount of mucous within the LEFT sphenoid sinus noted. Other: A small to moderate posterior LEFT scalp hematoma noted. CT CERVICAL SPINE FINDINGS Alignment: Normal. Skull base and vertebrae: No acute fracture. No primary bone lesion or focal pathologic process. Soft tissues and spinal canal: No prevertebral fluid or swelling. No visible canal hematoma. Disc levels: Mild to moderate degenerative disc disease/spondylosis from C5-C7 and mild to moderate facet arthropathy within the UPPER cervical spine noted. Upper chest: Negative. Other: None IMPRESSION: 1. No evidence of acute intracranial abnormality. Probable mild chronic small-vessel white matter ischemic changes. 2. Posterior LEFT scalp hematoma without underlying fracture. 3. No static evidence of acute injury to the cervical spine. Degenerative changes as described. Electronically Signed   By: Margarette Canada M.D.   On: 02/05/2018 14:01   Ct Cervical Spine Wo Contrast  Result Date: 02/05/2018 CLINICAL DATA:  83 year old female with acute head and neck pain following fall and injury. Initial encounter. EXAM: CT HEAD WITHOUT CONTRAST CT CERVICAL SPINE WITHOUT CONTRAST TECHNIQUE: Multidetector CT imaging of the head and cervical spine was performed following the standard protocol without intravenous contrast. Multiplanar CT image reconstructions of the cervical spine were also generated. COMPARISON:  None. FINDINGS: CT HEAD FINDINGS Brain: No evidence of acute infarction, hemorrhage, hydrocephalus, extra-axial collection or mass lesion/mass  effect. Mild probable chronic small-vessel white matter ischemic changes noted. Vascular: Atherosclerotic calcifications noted. Skull: Normal. Negative for fracture or focal lesion. Sinuses/Orbits: No acute abnormality. A very small amount of mucous within the LEFT sphenoid sinus noted. Other: A small to moderate posterior LEFT scalp hematoma noted. CT CERVICAL SPINE FINDINGS Alignment: Normal. Skull base and vertebrae: No acute fracture. No primary bone lesion or focal pathologic process. Soft tissues and spinal canal: No prevertebral fluid or swelling. No visible canal hematoma. Disc levels: Mild to moderate degenerative disc disease/spondylosis from C5-C7 and mild to moderate facet arthropathy within the UPPER cervical spine noted. Upper chest: Negative. Other: None IMPRESSION: 1. No evidence of acute intracranial abnormality. Probable mild chronic small-vessel white matter ischemic changes. 2. Posterior LEFT scalp hematoma without underlying fracture. 3. No static evidence of acute injury to the cervical spine. Degenerative changes as described. Electronically Signed   By: Margarette Canada M.D.   On: 02/05/2018 14:01    Procedures Procedures (including critical care time)  Medications Ordered in ED Medications  acetaminophen (TYLENOL) tablet 1,000 mg (1,000 mg Oral Given 02/05/18 1747)  metoprolol tartrate (LOPRESSOR) tablet 50 mg (50 mg Oral Given 02/05/18 1748)     Initial Impression / Assessment and Plan / ED Course  I have reviewed the triage vital signs and the nursing notes.  Pertinent labs & imaging results that were available during my care  of the patient were reviewed by me and considered in my medical decision making (see chart for details).    Patient presents with mechanical fall isolated head injury. CT scan reviewed by myself and with patient with no acute findings. CT cervical reviewed no fractures. Patient's well-appearing pain is improved. Upon discharge blood pressure was noted to  be high. Patient's having no signs or symptoms of end organ damage. With blood pressure to 40s plan to give one of her home medicines, pain meds, check her kidney function, EKG and recheck.  Patient observed in the ER screening EKG reviewed and blood work unremarkable. Patient's blood pressure remained elevatedand is asymptomatic despite 7 hours of waiting in the ER. Patient strongly feels it is from the long rough day, anxiety. I discussed the risks and is significant elevated blood pressure with patient and daughter. Patient understands and prefers to go home/relax and monitor her blood pressure at home and see her doctor on Monday. Patient has capacity to make decisions. Patient understands if she has any symptoms she needs to call the ambulance/family to bring her back. Bp improved on discharge Results and differential diagnosis were discussed with the patient/parent/guardian. Xrays were independently reviewed by myself.  Close follow up outpatient was discussed, comfortable with the plan.   Medications  acetaminophen (TYLENOL) tablet 1,000 mg (1,000 mg Oral Given 02/05/18 1747)  metoprolol tartrate (LOPRESSOR) tablet 50 mg (50 mg Oral Given 02/05/18 1748)    Vitals:   02/05/18 1707 02/05/18 1748 02/05/18 1815 02/05/18 1845  BP: (!) 247/86  (!) 244/94 (!) 130/104  Pulse: (!) 58 (!) 50 (!) 50 (!) 53  Resp: 18     Temp: 97.7 F (36.5 C)     TempSrc: Oral     SpO2: 100%  100% 99%    Final diagnoses:  Acute head injury, initial encounter  Contusion of scalp, initial encounter  Essential hypertension      Final Clinical Impressions(s) / ED Diagnoses   Final diagnoses:  Acute head injury, initial encounter  Contusion of scalp, initial encounter  Essential hypertension    ED Discharge Orders    None       Elnora Morrison, MD 02/05/18 1850    Elnora Morrison, MD 02/05/18 [phone]

## 2018-02-08 DIAGNOSIS — I10 Essential (primary) hypertension: Secondary | ICD-10-CM | POA: Diagnosis not present

## 2018-02-08 DIAGNOSIS — F419 Anxiety disorder, unspecified: Secondary | ICD-10-CM | POA: Diagnosis not present

## 2018-02-22 DIAGNOSIS — I10 Essential (primary) hypertension: Secondary | ICD-10-CM | POA: Diagnosis not present

## 2018-03-21 DIAGNOSIS — H524 Presbyopia: Secondary | ICD-10-CM | POA: Diagnosis not present

## 2018-03-21 DIAGNOSIS — H5203 Hypermetropia, bilateral: Secondary | ICD-10-CM | POA: Diagnosis not present

## 2018-03-21 DIAGNOSIS — Z961 Presence of intraocular lens: Secondary | ICD-10-CM | POA: Diagnosis not present

## 2018-04-07 DIAGNOSIS — I10 Essential (primary) hypertension: Secondary | ICD-10-CM | POA: Diagnosis not present

## 2018-04-07 DIAGNOSIS — L237 Allergic contact dermatitis due to plants, except food: Secondary | ICD-10-CM | POA: Diagnosis not present

## 2018-04-07 DIAGNOSIS — E785 Hyperlipidemia, unspecified: Secondary | ICD-10-CM | POA: Diagnosis not present

## 2018-05-20 DIAGNOSIS — E559 Vitamin D deficiency, unspecified: Secondary | ICD-10-CM | POA: Diagnosis not present

## 2018-05-20 DIAGNOSIS — E7800 Pure hypercholesterolemia, unspecified: Secondary | ICD-10-CM | POA: Diagnosis not present

## 2018-05-26 DIAGNOSIS — R7989 Other specified abnormal findings of blood chemistry: Secondary | ICD-10-CM | POA: Diagnosis not present

## 2018-05-26 DIAGNOSIS — E785 Hyperlipidemia, unspecified: Secondary | ICD-10-CM | POA: Diagnosis not present

## 2018-05-26 DIAGNOSIS — F419 Anxiety disorder, unspecified: Secondary | ICD-10-CM | POA: Diagnosis not present

## 2018-05-26 DIAGNOSIS — I10 Essential (primary) hypertension: Secondary | ICD-10-CM | POA: Diagnosis not present

## 2018-06-09 DIAGNOSIS — I10 Essential (primary) hypertension: Secondary | ICD-10-CM | POA: Diagnosis not present

## 2018-06-16 ENCOUNTER — Other Ambulatory Visit: Payer: Self-pay | Admitting: Internal Medicine

## 2018-06-16 DIAGNOSIS — Z1231 Encounter for screening mammogram for malignant neoplasm of breast: Secondary | ICD-10-CM

## 2018-07-27 ENCOUNTER — Ambulatory Visit
Admission: RE | Admit: 2018-07-27 | Discharge: 2018-07-27 | Disposition: A | Discharge status: Home / Self Care | Payer: PPO | Source: Ambulatory Visit | Attending: Internal Medicine | Admitting: Internal Medicine

## 2018-07-27 DIAGNOSIS — Z1231 Encounter for screening mammogram for malignant neoplasm of breast: Secondary | ICD-10-CM

## 2018-08-04 DIAGNOSIS — L570 Actinic keratosis: Secondary | ICD-10-CM | POA: Diagnosis not present

## 2018-08-04 DIAGNOSIS — L819 Disorder of pigmentation, unspecified: Secondary | ICD-10-CM | POA: Diagnosis not present

## 2018-09-19 DIAGNOSIS — D485 Neoplasm of uncertain behavior of skin: Secondary | ICD-10-CM | POA: Diagnosis not present

## 2018-09-19 DIAGNOSIS — L439 Lichen planus, unspecified: Secondary | ICD-10-CM | POA: Diagnosis not present

## 2018-12-22 DIAGNOSIS — E559 Vitamin D deficiency, unspecified: Secondary | ICD-10-CM | POA: Diagnosis not present

## 2018-12-22 DIAGNOSIS — Z0000 Encounter for general adult medical examination without abnormal findings: Secondary | ICD-10-CM | POA: Diagnosis not present

## 2018-12-22 DIAGNOSIS — I10 Essential (primary) hypertension: Secondary | ICD-10-CM | POA: Diagnosis not present

## 2018-12-22 DIAGNOSIS — H2512 Age-related nuclear cataract, left eye: Secondary | ICD-10-CM | POA: Diagnosis not present

## 2019-03-15 DIAGNOSIS — L814 Other melanin hyperpigmentation: Secondary | ICD-10-CM | POA: Diagnosis not present

## 2019-03-15 DIAGNOSIS — L308 Other specified dermatitis: Secondary | ICD-10-CM | POA: Diagnosis not present

## 2019-03-15 DIAGNOSIS — D1801 Hemangioma of skin and subcutaneous tissue: Secondary | ICD-10-CM | POA: Diagnosis not present

## 2019-03-15 DIAGNOSIS — L821 Other seborrheic keratosis: Secondary | ICD-10-CM | POA: Diagnosis not present

## 2019-03-15 DIAGNOSIS — I8393 Asymptomatic varicose veins of bilateral lower extremities: Secondary | ICD-10-CM | POA: Diagnosis not present

## 2019-03-15 DIAGNOSIS — L570 Actinic keratosis: Secondary | ICD-10-CM | POA: Diagnosis not present

## 2019-03-15 DIAGNOSIS — L819 Disorder of pigmentation, unspecified: Secondary | ICD-10-CM | POA: Diagnosis not present

## 2019-03-15 DIAGNOSIS — D229 Melanocytic nevi, unspecified: Secondary | ICD-10-CM | POA: Diagnosis not present

## 2019-06-15 ENCOUNTER — Other Ambulatory Visit: Payer: Self-pay | Admitting: Internal Medicine

## 2019-06-15 DIAGNOSIS — Z1231 Encounter for screening mammogram for malignant neoplasm of breast: Secondary | ICD-10-CM

## 2019-06-26 DIAGNOSIS — H524 Presbyopia: Secondary | ICD-10-CM | POA: Diagnosis not present

## 2019-06-26 DIAGNOSIS — Z961 Presence of intraocular lens: Secondary | ICD-10-CM | POA: Diagnosis not present

## 2019-06-26 DIAGNOSIS — H5203 Hypermetropia, bilateral: Secondary | ICD-10-CM | POA: Diagnosis not present

## 2019-07-17 DIAGNOSIS — E785 Hyperlipidemia, unspecified: Secondary | ICD-10-CM | POA: Diagnosis not present

## 2019-07-17 DIAGNOSIS — I10 Essential (primary) hypertension: Secondary | ICD-10-CM | POA: Diagnosis not present

## 2019-07-17 DIAGNOSIS — Z0000 Encounter for general adult medical examination without abnormal findings: Secondary | ICD-10-CM | POA: Diagnosis not present

## 2019-07-17 DIAGNOSIS — E559 Vitamin D deficiency, unspecified: Secondary | ICD-10-CM | POA: Diagnosis not present

## 2019-07-20 DIAGNOSIS — I4891 Unspecified atrial fibrillation: Secondary | ICD-10-CM | POA: Diagnosis not present

## 2019-07-20 DIAGNOSIS — Z0000 Encounter for general adult medical examination without abnormal findings: Secondary | ICD-10-CM | POA: Diagnosis not present

## 2019-07-20 DIAGNOSIS — I10 Essential (primary) hypertension: Secondary | ICD-10-CM | POA: Diagnosis not present

## 2019-07-20 DIAGNOSIS — E785 Hyperlipidemia, unspecified: Secondary | ICD-10-CM | POA: Diagnosis not present

## 2019-07-24 DIAGNOSIS — I4819 Other persistent atrial fibrillation: Secondary | ICD-10-CM | POA: Insufficient documentation

## 2019-07-24 DIAGNOSIS — I10 Essential (primary) hypertension: Secondary | ICD-10-CM | POA: Insufficient documentation

## 2019-07-24 DIAGNOSIS — E782 Mixed hyperlipidemia: Secondary | ICD-10-CM | POA: Insufficient documentation

## 2019-07-24 NOTE — Progress Notes (Signed)
Patient referred by [name], MD for atrial fibrillation  Subjective:   [name], female    DOB: 1934-09-29, 84 y.o.   MRN: [id number]   Chief Complaint  Patient presents with  . Atrial Fibrillation  . Hypertension  . New Patient (Initial Visit)     HPI  84 y.o. Caucasian female with hypertension, hyperlipidemia, referred for management of atrial fibrillation.  Patient lives with her disabled husband, for whom she is the primary caretaker.  Patient stays active taking care of her husband, and her 2 acre property.  She performs all daily chores.  She also does stationary bicycle exercises for 5 miles.  She does not walk much due to her painful right knee with history of meniscal repair.  With this level of activity, she does not have any chest pain, shortness of breath, presyncope, syncope, orthopnea, PND, leg swelling.  She does not experience heart palpitations.  She was recently seen by her PCP Dr. Shelia Media on 07/20/2019, when EKG incidentally showed atrial fibrillation.  Her previous EKG was nearly 2 years ago.  Patient has been on Xarelto 20 mg daily since then.  She noticed occasional episode of lightheadedness when she stood up after picking up leaves on the ground.  Otherwise, she has not noticed any symptoms.  Blood pressure is elevated today, but lower at her home checks, according to the patient.  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  Past Medical History:  Diagnosis Date  . Cancer ([place])    skin  . Hypertension      Past Surgical History:  Procedure Laterality Date  . ABDOMINAL HYSTERECTOMY    . CHOLECYSTECTOMY       Social History   Socioeconomic History  . Marital status: Married    Spouse name: Not on file  . Number of children: Not on file  . Years of education: Not on file  . Highest education level: Not on file  Occupational History  . Not on  file  Social Needs  . Financial resource strain: Not on file  . Food insecurity    Worry: Not on file    Inability: Not on file  . Transportation needs    Medical: Not on file    Non-medical: Not on file  Tobacco Use  . Smoking status: Former Research scientist (life sciences)  . Smokeless tobacco: Never Used  Substance and Sexual Activity  . Alcohol use: Never    Frequency: Never  . Drug use: Never  . Sexual activity: Not on file  Lifestyle  . Physical activity    Days per week: Not on file    Minutes per session: Not on file  . Stress: Not on file  Relationships  . Social Herbalist on phone: Not on file    Gets together: Not on file    Attends religious service: Not on file    Active member of club or organization: Not on file    Attends meetings of clubs or organizations: Not on file    Relationship status: Not on file  . Intimate partner violence    Fear of current or ex partner: Not on file    Emotionally abused: Not on file    Physically abused: Not on file    Forced sexual activity: Not on file  Other Topics Concern  . Not on file  Social History  Narrative  . Not on file     Family History  Problem Relation Age of Onset  . Breast cancer Son        unsure of age     Current Outpatient Medications on File Prior to Visit  Medication Sig Dispense Refill  . amLODipine (NORVASC) 5 MG tablet Take 5 mg by mouth daily.    . Cholecalciferol (VITAMIN D3) 25 MCG (1000 UT) CAPS Take by mouth daily.    . clonazePAM (KLONOPIN) 0.5 MG tablet Take 0.5 mg by mouth 2 (two) times daily as needed for anxiety.    . Cyanocobalamin (VITAMIN B12 PO) Take by mouth daily.    . irbesartan (AVAPRO) 300 MG tablet Take 300 mg by mouth daily.    . metoprolol tartrate (LOPRESSOR) 50 MG tablet Take 50 mg by mouth daily.    . Multiple Vitamins-Minerals (MULTIVITAMIN ADULT PO) Take by mouth daily.    . Potassium 99 MG TABS Take by mouth daily.    . pravastatin (PRAVACHOL) 80 MG tablet Take 80 mg by  mouth daily. 1/2 tab daily    . rivaroxaban (XARELTO) 20 MG TABS tablet Take 20 mg by mouth daily with supper.    . torsemide (DEMADEX) 20 MG tablet Take 20 mg by mouth daily. 1/2 tab daily    . vitamin C (ASCORBIC ACID) 500 MG tablet Take 500 mg by mouth daily.     No current facility-administered medications on file prior to visit.     Cardiovascular studies:  EKG 07/25/2019: Atrial fibrillation with controlled vetnricular rate 85 bpm.  EKG 07/20/2019: Atrial fibrillation with controlled ventricular rate 85 bpm.   Recent labs: 07/17/2019: Glucose 88. BUN/Cr 22/0.79. eGFR 70. Na/K 142/4.9. Rest of the CMP normal. H/H 12.8/39.2. MCV 96. Platelets 159. Chol 138, TG 75, HDL 55, LDL 68.    Review of Systems  Constitution: Negative for decreased appetite, malaise/fatigue, weight gain and weight loss.  HENT: Negative for congestion.   Eyes: Negative for visual disturbance.  Cardiovascular: Negative for chest pain, dyspnea on exertion, leg swelling, palpitations and syncope.  Respiratory: Negative for cough.   Endocrine: Negative for cold intolerance.  Hematologic/Lymphatic: Does not bruise/bleed easily.  Skin: Negative for itching and rash.  Musculoskeletal: Negative for myalgias.  Gastrointestinal: Negative for abdominal pain, nausea and vomiting.  Genitourinary: Negative for dysuria.  Neurological: Negative for dizziness and weakness.  Psychiatric/Behavioral: The patient is not nervous/anxious.   All other systems reviewed and are negative.        Vitals:   07/25/19 0855 07/25/19 0923  BP: (!) 185/102 (!) 157/87  Pulse: 61   SpO2: 97%      Body mass index is 27.71 kg/m. Filed Weights   07/25/19 0855  Weight: 176 lb 14.4 oz (80.2 kg)     Objective:   Physical Exam  Constitutional: She is oriented to person, place, and time. She appears well-developed and well-nourished. No distress.  HENT:  Head: Normocephalic and atraumatic.  Eyes: Pupils are equal, round,  and reactive to light. Conjunctivae are normal.  Neck: No JVD present.  Cardiovascular: Normal rate and intact distal pulses. An irregularly irregular rhythm present.  No murmur heard. Pulmonary/Chest: Effort normal and breath sounds normal. She has no wheezes. She has no rales.  Abdominal: Soft. Bowel sounds are normal. There is no rebound.  Musculoskeletal:        General: No edema.  Lymphadenopathy:    She has no cervical adenopathy.  Neurological: She is alert and  oriented to person, place, and time. No cranial nerve deficit.  Skin: Skin is warm and dry.  Psychiatric: She has a normal mood and affect.  Nursing note and vitals reviewed.         Assessment & Recommendations:   84 y.o. Caucasian female with hypertension, hyperlipidemia, persistent atrial fibrillation.  Persistent atrial fibrillation: Rate controlled. CHA2DS2VASc score 4, annual; stroke risk 5%. Continue Xarelto 20 mg daily. Patient is completely asymptomatic.  Suspicion of ischemia is low, thus not performing stress test at this time.  I will obtain echocardiogram.  I discussed conservative approach with rate control, versus attempt with rhythm control.  Unless echocardiogram shows reduced LV function, I think it is reasonable to continue rate control and anticoagulation alone.  I have asked her to reduce metoprolol tartrate from 50 mg daily to 25 mg twice daily.  I will recheck blood pressure at next visit.    Hypertension: Blood pressure elevated today.  Will recheck at next visit.  Hyperlipidemia: Well-controlled.  I will see her after echocardiogram.   Thank you for referring the patient to [place]. Please feel free to contact with any questions.  Nigel Mormon, MD [facility] Pager: [phone] Office: [phone] If no answer Cell [phone]

## 2019-07-25 ENCOUNTER — Ambulatory Visit (INDEPENDENT_AMBULATORY_CARE_PROVIDER_SITE_OTHER): Payer: PPO | Admitting: Cardiology

## 2019-07-25 ENCOUNTER — Other Ambulatory Visit: Payer: Self-pay

## 2019-07-25 ENCOUNTER — Encounter: Payer: Self-pay | Admitting: Cardiology

## 2019-07-25 VITALS — BP 157/87 | HR 61 | Ht 67.0 in | Wt 176.9 lb

## 2019-07-25 DIAGNOSIS — E782 Mixed hyperlipidemia: Secondary | ICD-10-CM

## 2019-07-25 DIAGNOSIS — I4819 Other persistent atrial fibrillation: Secondary | ICD-10-CM

## 2019-07-25 DIAGNOSIS — I10 Essential (primary) hypertension: Secondary | ICD-10-CM | POA: Diagnosis not present

## 2019-07-25 MED ORDER — METOPROLOL TARTRATE 25 MG PO TABS: 25.0000 mg | ORAL_TABLET | Freq: Two times a day (BID) | ORAL | 3 refills | Status: DC

## 2019-07-31 ENCOUNTER — Other Ambulatory Visit: Payer: Self-pay

## 2019-07-31 ENCOUNTER — Ambulatory Visit (INDEPENDENT_AMBULATORY_CARE_PROVIDER_SITE_OTHER): Payer: PPO

## 2019-07-31 DIAGNOSIS — I4819 Other persistent atrial fibrillation: Secondary | ICD-10-CM

## 2019-07-31 MED ORDER — RIVAROXABAN 20 MG PO TABS: 20.0000 mg | ORAL_TABLET | Freq: Every day | ORAL | 1 refills | Status: DC

## 2019-08-02 ENCOUNTER — Ambulatory Visit
Admission: RE | Admit: 2019-08-02 | Discharge: 2019-08-02 | Disposition: A | Discharge status: Home / Self Care | Payer: PPO | Source: Ambulatory Visit | Attending: Internal Medicine | Admitting: Internal Medicine

## 2019-08-02 ENCOUNTER — Other Ambulatory Visit: Payer: Self-pay

## 2019-08-02 DIAGNOSIS — Z1231 Encounter for screening mammogram for malignant neoplasm of breast: Secondary | ICD-10-CM

## 2019-08-03 NOTE — Progress Notes (Signed)
Tried to call pt was not home was told to call back later

## 2019-08-31 ENCOUNTER — Encounter: Payer: Self-pay | Admitting: Cardiology

## 2019-08-31 ENCOUNTER — Ambulatory Visit (INDEPENDENT_AMBULATORY_CARE_PROVIDER_SITE_OTHER): Payer: PPO | Admitting: Cardiology

## 2019-08-31 ENCOUNTER — Other Ambulatory Visit: Payer: Self-pay

## 2019-08-31 VITALS — BP 160/80 | HR 79 | Temp 97.7°F | Ht 67.0 in | Wt 171.0 lb

## 2019-08-31 DIAGNOSIS — E782 Mixed hyperlipidemia: Secondary | ICD-10-CM

## 2019-08-31 DIAGNOSIS — I10 Essential (primary) hypertension: Secondary | ICD-10-CM

## 2019-08-31 DIAGNOSIS — I4819 Other persistent atrial fibrillation: Secondary | ICD-10-CM

## 2019-08-31 NOTE — Progress Notes (Signed)
Patient referred by [name], MD for atrial fibrillation  Subjective:   [name], female    DOB: [birth year], 84 y.o.   MRN: [id number]   Chief Complaint  Patient presents with  . Atrial Fibrillation  . Follow-up  . Results    echo    84 y.o. Caucasian female with hypertension, hyperlipidemia, referred for management of atrial fibrillation.  Echocardiogram results discussed with the patient. She continues to be asymptomatic. Blood pressure is elevated today, but remains well controlled on most days.   Initial consult HPI 07/25/2019: Patient lives with her disabled husband, for whom she is the primary caretaker.  Patient stays active taking care of her husband, and her 2 acre property.  She performs all daily chores.  She also does stationary bicycle exercises for 5 miles.  She does not walk much due to her painful right knee with history of meniscal repair.  With this level of activity, she does not have any chest pain, shortness of breath, presyncope, syncope, orthopnea, PND, leg swelling.  She does not experience heart palpitations.  She was recently seen by her PCP Dr. Shelia Media on 07/20/2019, when EKG incidentally showed atrial fibrillation.  Her previous EKG was nearly 2 years ago.  Patient has been on Xarelto 20 mg daily since then.  She noticed occasional episode of lightheadedness when she stood up after picking up leaves on the ground.  Otherwise, she has not noticed any symptoms.  Blood pressure is elevated today, but lower at her home checks, according to the patient.  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  Past Medical History:  Diagnosis Date  . Cancer ([place])    skin  . Hyperlipidemia   . Hypertension      Past Surgical History:  Procedure Laterality Date  . ABDOMINAL HYSTERECTOMY    . CHOLECYSTECTOMY    . GALLBLADDER SURGERY       Social History    Socioeconomic History  . Marital status: Married    Spouse name: Not on file  . Number of children: 2  . Years of education: Not on file  . Highest education level: Not on file  Occupational History  . Not on file  Tobacco Use  . Smoking status: Former Smoker    Packs/day: 0.25    Years: 5.00    Pack years: 1.25    Types: Cigarettes    Quit date: 1964    Years since quitting: 56.9  . Smokeless tobacco: Never Used  Substance and Sexual Activity  . Alcohol use: Not Currently  . Drug use: Never  . Sexual activity: Not on file  Other Topics Concern  . Not on file  Social History Narrative  . Not on file   Social Determinants of Health   Financial Resource Strain:   . Difficulty of Paying Living Expenses: Not on file  Food Insecurity:   . Worried About Charity fundraiser in the Last Year: Not on file  . Ran Out of Food in the Last Year: Not on file  Transportation Needs:   . Lack of Transportation (Medical): Not on file  . Lack of Transportation (Non-Medical): Not on file  Physical Activity:   . Days of Exercise per Week: Not on file  . Minutes of Exercise per Session: Not on file  Stress:   . Feeling of Stress : Not on file  Social Connections:   . Frequency of Communication with Friends and Family: Not on file  . Frequency of Social Gatherings with Friends and Family: Not on file  . Attends Religious Services: Not on file  . Active Member of Clubs or Organizations: Not on file  . Attends Archivist Meetings: Not on file  . Marital Status: Not on file  Intimate Partner Violence:   . Fear of Current or Ex-Partner: Not on file  . Emotionally Abused: Not on file  . Physically Abused: Not on file  . Sexually Abused: Not on file     Family History  Adopted: Yes  Problem Relation Age of Onset  . Breast cancer Son        unsure of age  . Stroke Mother   . Aneurysm Brother   . Parkinson's disease Brother      Current Outpatient Medications on File  Prior to Visit  Medication Sig Dispense Refill  . amLODipine (NORVASC) 5 MG tablet Take 5 mg by mouth daily.    . Cholecalciferol (VITAMIN D3) 25 MCG (1000 UT) CAPS Take by mouth daily.    . clonazePAM (KLONOPIN) 0.5 MG tablet Take 0.5 mg by mouth 2 (two) times daily as needed for anxiety.    . Cyanocobalamin (VITAMIN B12 PO) Take by mouth daily.    . irbesartan (AVAPRO) 300 MG tablet Take 300 mg by mouth daily.    . metoprolol tartrate (LOPRESSOR) 25 MG tablet Take 1 tablet (25 mg total) by mouth 2 (two) times daily. 60 tablet 3  . Multiple Vitamins-Minerals (MULTIVITAMIN ADULT PO) Take by mouth daily.    . Potassium 99 MG TABS Take by mouth daily.    . pravastatin (PRAVACHOL) 80 MG tablet Take 80 mg by mouth daily. 1/2 tab daily    . rivaroxaban (XARELTO) 20 MG TABS tablet Take 1 tablet (20 mg total) by mouth daily with supper. 90 tablet 1  . torsemide (DEMADEX) 20 MG tablet Take 20 mg by mouth daily. 1/2 tab daily    . vitamin C (ASCORBIC ACID) 500 MG tablet Take 500 mg by mouth daily.     No current facility-administered medications on file prior to visit.    Cardiovascular studies:  Echocardiogram 07/31/2019: Left ventricle cavity is normal in size. Moderate concentric hypertrophy of the left ventricle. Normal LV systolic function with EF 55%. Normal global wall motion. Unable to evaluate diastolic function due to atrial fibrillation.  Left atrial cavity is moderately dilated. Mild mitral valve leaflet calcification. Moderate (Grade III) mitral regurgitation. Moderate to severe tricuspid regurgitation. Moderate pulmonary hypertension. Estimated pulmonary artery systolic pressure is 46 mmHg.  IVC is dilated with a respiratory response of <50%. Estimated RA pressure 10-15 mmHg.  EKG 07/25/2019: Atrial fibrillation with controlled vetnricular rate 85 bpm.  EKG 07/20/2019: Atrial fibrillation with controlled ventricular rate 85 bpm.   Recent labs: 07/17/2019: Glucose 88. BUN/Cr  22/0.79. eGFR 70. Na/K 142/4.9. Rest of the CMP normal. H/H 12.8/39.2. MCV 96. Platelets 159. Chol 138, TG 75, HDL 55, LDL 68.    Review of Systems  Constitution: Negative for decreased appetite, malaise/fatigue, weight gain and weight loss.  HENT: Negative for congestion.   Eyes: Negative for visual disturbance.  Cardiovascular: Negative for chest pain, dyspnea on exertion, leg swelling, palpitations and syncope.  Respiratory: Negative for cough.   Endocrine: Negative for cold intolerance.  Hematologic/Lymphatic: Does not bruise/bleed easily.  Skin: Negative for itching and rash.  Musculoskeletal: Negative for myalgias.  Gastrointestinal: Negative  for abdominal pain, nausea and vomiting.  Genitourinary: Negative for dysuria.  Neurological: Negative for dizziness and weakness.  Psychiatric/Behavioral: The patient is not nervous/anxious.   All other systems reviewed and are negative.        Vitals:   08/31/19 1017  BP: (!) 160/80  Pulse: 79  Temp: 97.7 F (36.5 C)  SpO2: 100%     Body mass index is 26.78 kg/m. Filed Weights   08/31/19 1017  Weight: 171 lb (77.6 kg)     Objective:   Physical Exam  Constitutional: She is oriented to person, place, and time. She appears well-developed and well-nourished. No distress.  HENT:  Head: Normocephalic and atraumatic.  Eyes: Pupils are equal, round, and reactive to light. Conjunctivae are normal.  Neck: No JVD present.  Cardiovascular: Normal rate and intact distal pulses. An irregularly irregular rhythm present.  No murmur heard. Pulmonary/Chest: Effort normal and breath sounds normal. She has no wheezes. She has no rales.  Abdominal: Soft. Bowel sounds are normal. There is no rebound.  Musculoskeletal:        General: No edema.  Lymphadenopathy:    She has no cervical adenopathy.  Neurological: She is alert and oriented to person, place, and time. No cranial nerve deficit.  Skin: Skin is warm and dry.  Psychiatric:  She has a normal mood and affect.  Nursing note and vitals reviewed.         Assessment & Recommendations:   84 y.o. Caucasian female with hypertension, hyperlipidemia, persistent atrial fibrillation.  Persistent atrial fibrillation: Rate controlled, likely longstanding.  CHA2DS2VASc score 4, annual; stroke risk 5%. Continue Xarelto 20 mg daily. Patient is completely asymptomatic. I will continue rate control therapy at this time. I do not think rhythm control is warranted, nor is it likely to sustain. Suspicion of ischemia is low, thus not performing stress test at this time.  Continue metoprolol tartrate 25 mg twice daily.  Hypertension: Continue current antihypertensive therapy for now. Encourage regular monitoring and f/u w/Dr. Shelia Media.   Hyperlipidemia: Well-controlled.  F/u in 6 months.    Nigel Mormon, MD [facility] Pager: [phone] Office: [phone] If no answer Cell [phone]

## 2019-09-18 DIAGNOSIS — L570 Actinic keratosis: Secondary | ICD-10-CM | POA: Diagnosis not present

## 2019-09-18 DIAGNOSIS — D1801 Hemangioma of skin and subcutaneous tissue: Secondary | ICD-10-CM | POA: Diagnosis not present

## 2019-09-18 DIAGNOSIS — D225 Melanocytic nevi of trunk: Secondary | ICD-10-CM | POA: Diagnosis not present

## 2019-10-09 NOTE — Telephone Encounter (Signed)
Please read

## 2019-12-06 ENCOUNTER — Other Ambulatory Visit: Payer: Self-pay | Admitting: Cardiology

## 2019-12-06 DIAGNOSIS — I4819 Other persistent atrial fibrillation: Secondary | ICD-10-CM

## 2020-01-11 DIAGNOSIS — E785 Hyperlipidemia, unspecified: Secondary | ICD-10-CM | POA: Diagnosis not present

## 2020-01-18 DIAGNOSIS — E785 Hyperlipidemia, unspecified: Secondary | ICD-10-CM | POA: Diagnosis not present

## 2020-01-18 DIAGNOSIS — Z7901 Long term (current) use of anticoagulants: Secondary | ICD-10-CM | POA: Diagnosis not present

## 2020-01-18 DIAGNOSIS — I10 Essential (primary) hypertension: Secondary | ICD-10-CM | POA: Diagnosis not present

## 2020-01-18 DIAGNOSIS — I4891 Unspecified atrial fibrillation: Secondary | ICD-10-CM | POA: Diagnosis not present

## 2020-01-25 ENCOUNTER — Other Ambulatory Visit: Payer: Self-pay | Admitting: Cardiology

## 2020-02-29 ENCOUNTER — Ambulatory Visit: Payer: PPO | Admitting: Cardiology

## 2020-02-29 ENCOUNTER — Other Ambulatory Visit: Payer: Self-pay

## 2020-02-29 ENCOUNTER — Encounter: Payer: Self-pay | Admitting: Cardiology

## 2020-02-29 VITALS — BP 189/108 | HR 66 | Ht 67.0 in | Wt 171.3 lb

## 2020-02-29 DIAGNOSIS — I10 Essential (primary) hypertension: Secondary | ICD-10-CM | POA: Diagnosis not present

## 2020-02-29 DIAGNOSIS — E782 Mixed hyperlipidemia: Secondary | ICD-10-CM

## 2020-02-29 DIAGNOSIS — I4819 Other persistent atrial fibrillation: Secondary | ICD-10-CM | POA: Diagnosis not present

## 2020-02-29 NOTE — Progress Notes (Signed)
Patient referred by [name], MD for atrial fibrillation  Subjective:   [name], female    DOB: Oct 09, 1934, 85 y.o.   MRN: [id number]   Chief Complaint  Patient presents with  . Atrial Fibrillation    85 y.o. Caucasian female with hypertension, hyperlipidemia, persistent atrial fibrillation, moderate mitral regurgitation  Patient denies chest pain, shortness of breath, palpitations, leg edema, orthopnea, PND, TIA/syncope. Blood pressure is elevated. She reports that she was upset this morning after a discussion with his disabled husband, for whom she is the primary caregiver.  She denies chest pain, shortness of breath, blurry vision, headache, or any other symptoms suggestive of hypertensive urgency.  Initial consult HPI 07/25/2019: Patient lives with her disabled husband, for whom she is the primary caretaker.  Patient stays active taking care of her husband, and her 2 acre property.  She performs all daily chores.  She also does stationary bicycle exercises for 5 miles.  She does not walk much due to her painful right knee with history of meniscal repair.  With this level of activity, she does not have any chest pain, shortness of breath, presyncope, syncope, orthopnea, PND, leg swelling.  She does not experience heart palpitations.  She was recently seen by her PCP Dr. Shelia Media on 07/20/2019, when EKG incidentally showed atrial fibrillation.  Her previous EKG was nearly 2 years ago.  Patient has been on Xarelto 20 mg daily since then.  She noticed occasional episode of lightheadedness when she stood up after picking up leaves on the ground.  Otherwise, she has not noticed any symptoms.  Blood pressure is elevated today, but lower at her home checks, according to the patient.  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    Current Outpatient Medications on File Prior to Visit    Medication Sig Dispense Refill  . amLODipine (NORVASC) 5 MG tablet Take 5 mg by mouth daily.    . Cholecalciferol (VITAMIN D3) 25 MCG (1000 UT) CAPS Take by mouth daily.    . clonazePAM (KLONOPIN) 0.5 MG tablet Take 0.5 mg by mouth 2 (two) times daily as needed for anxiety.    . Cyanocobalamin (VITAMIN B12 PO) Take by mouth daily.    . irbesartan (AVAPRO) 300 MG tablet Take 300 mg by mouth daily.    . metoprolol tartrate (LOPRESSOR) 25 MG tablet TAKE 1 TABLET BY MOUTH 2 TIMES DAILY 60 tablet 3  . Multiple Vitamins-Minerals (MULTIVITAMIN ADULT PO) Take by mouth daily.    . Potassium 99 MG TABS Take by mouth daily.    . pravastatin (PRAVACHOL) 80 MG tablet Take 80 mg by mouth daily. 1/2 tab daily    . torsemide (DEMADEX) 20 MG tablet Take 20 mg by mouth daily. 1/2 tab daily    . vitamin C (ASCORBIC ACID) 500 MG tablet Take 500 mg by mouth daily.    Alveda Reasons 20 MG TABS tablet TAKE 1 TABLET BY MOUTH ONCE DAILY WITH SUPPER 90 tablet 1   No current facility-administered medications on file prior to visit.    Cardiovascular studies:  EKG 02/29/2020: Atrial fibrillation 91 bpm Anteroseptal infarct -age undetermined.  Low voltage  Echocardiogram 07/31/2019: Left ventricle cavity is normal in size. Moderate concentric hypertrophy of the left ventricle. Normal LV systolic function with EF 55%. Normal global wall motion. Unable to evaluate diastolic function due to atrial fibrillation.  Left  atrial cavity is moderately dilated. Mild mitral valve leaflet calcification. Moderate (Grade III) mitral regurgitation. Moderate to severe tricuspid regurgitation. Moderate pulmonary hypertension. Estimated pulmonary artery systolic pressure is 46 mmHg.  IVC is dilated with a respiratory response of <50%. Estimated RA pressure 10-15 mmHg.  EKG 07/25/2019: Atrial fibrillation with controlled vetnricular rate 85 bpm.  EKG 07/20/2019: Atrial fibrillation with controlled ventricular rate 85 bpm.   Recent  labs: 01/11/2020: Glucose 88, BUN/Cr 18/0.7. EGFR 70 HbA1C N/A% Chol 132, TG 74, HDL 57, LDL 73 TSH 3.4 normal  07/17/2019: Glucose 88. BUN/Cr 22/0.79. eGFR 70. Na/K 142/4.9. Rest of the CMP normal. H/H 12.8/39.2. MCV 96. Platelets 159. Chol 138, TG 75, HDL 55, LDL 68.    Review of Systems  Cardiovascular: Negative for chest pain, dyspnea on exertion, leg swelling, palpitations and syncope.         Vitals:   02/29/20 1014 02/29/20 1021  BP: (!) 205/109 (!) 188/114  Pulse: 84 82  SpO2: 99%      Body mass index is 26.83 kg/m. Filed Weights   02/29/20 1014  Weight: 171 lb 4.8 oz (77.7 kg)     Objective:   Physical Exam Vitals and nursing note reviewed.  Constitutional:      General: She is not in acute distress. Neck:     Vascular: No JVD.  Cardiovascular:     Rate and Rhythm: Normal rate and regular rhythm.     Pulses: Intact distal pulses.     Heart sounds: Normal heart sounds. No murmur heard.   Pulmonary:     Effort: Pulmonary effort is normal.     Breath sounds: Normal breath sounds. No wheezing or rales.           Assessment & Recommendations:   85 y.o. Caucasian female with hypertension, hyperlipidemia, persistent atrial fibrillation, moderate mitral regurgitation   Persistent atrial fibrillation: Rate controlled, likely longstanding.  CHA2DS2VASc score 4, annual; stroke risk 5%. Continue Xarelto 20 mg daily. Patient is completely asymptomatic. I will continue rate control therapy at this time. I do not think rhythm control is warranted, nor is it likely to sustain. Suspicion of ischemia is low, thus not performing stress test at this time.  Continue metoprolol tartrate 25 mg twice daily.  Moderate mitral regurgitation, mild PH: Secondary to Afib. Clinically stable, asymptomatic  Hypertension: Elevated today at office visit.  Patient did call us after reaching home.  Blood pressure was down to 156/90 mmHg.   She is reluctant to make  changes to her medical regimen at this time.  Continue amlodipine 5 mg daily, irbesartan 300 mg daily, metoprolol tartrate 25 mg daily.   Recommend remote patient monitoring for close blood pressure checks.  If blood pressure remains uncontrolled, could increase amlodipine to 10 mg daily.  Hyperlipidemia: Well-controlled.  F/u in 4 weeks   [place], MD [facility] Pager: [phone] Office: [phone] If no answer Cell [phone]

## 2020-03-15 ENCOUNTER — Telehealth: Payer: Self-pay

## 2020-03-15 NOTE — Telephone Encounter (Signed)
Patient stated that she has already talked to the doctor. Problem resolved.

## 2020-03-18 DIAGNOSIS — D229 Melanocytic nevi, unspecified: Secondary | ICD-10-CM | POA: Diagnosis not present

## 2020-03-18 DIAGNOSIS — L821 Other seborrheic keratosis: Secondary | ICD-10-CM | POA: Diagnosis not present

## 2020-03-18 DIAGNOSIS — L570 Actinic keratosis: Secondary | ICD-10-CM | POA: Diagnosis not present

## 2020-03-18 DIAGNOSIS — L814 Other melanin hyperpigmentation: Secondary | ICD-10-CM | POA: Diagnosis not present

## 2020-03-20 DIAGNOSIS — I10 Essential (primary) hypertension: Secondary | ICD-10-CM | POA: Diagnosis not present

## 2020-04-02 NOTE — Progress Notes (Signed)
Patient referred by [name], MD for atrial fibrillation  Subjective:   [name], female    DOB: [birth year], 85 y.o.   MRN: [id number]   Chief Complaint  Patient presents with  . Hypertension  . Atrial Fibrillation    85 y.o. Caucasian female with hypertension, hyperlipidemia, persistent atrial fibrillation, moderate mitral regurgitation.  Blood pressures have been labile. Patient attributes these to stress at home related to careg iving for her ailing husband. She has been started on Klonapin for anxiety by her PCP. Avg BP is 150/94 mmHg.   She denies chest pain, shortness of breath, palpitations, leg edema, orthopnea, PND, TIA/syncope.   Current Outpatient Medications on File Prior to Visit  Medication Sig Dispense Refill  . amLODipine (NORVASC) 5 MG tablet Take 5 mg by mouth daily.    . Cholecalciferol (VITAMIN D3) 25 MCG (1000 UT) CAPS Take by mouth daily.    . clonazePAM (KLONOPIN) 0.5 MG tablet Take 0.5 mg by mouth 2 (two) times daily as needed for anxiety.    . Cyanocobalamin (VITAMIN B12 PO) Take by mouth daily.    . irbesartan (AVAPRO) 300 MG tablet Take 300 mg by mouth daily.    . metoprolol tartrate (LOPRESSOR) 25 MG tablet TAKE 1 TABLET BY MOUTH 2 TIMES DAILY 60 tablet 3  . Multiple Vitamins-Minerals (MULTIVITAMIN ADULT PO) Take by mouth daily.    . Potassium 99 MG TABS Take by mouth daily.    . pravastatin (PRAVACHOL) 80 MG tablet Take 80 mg by mouth daily. 1/2 tab daily    . torsemide (DEMADEX) 20 MG tablet Take 20 mg by mouth daily. 1/2 tab daily    . vitamin C (ASCORBIC ACID) 500 MG tablet Take 500 mg by mouth daily.    Alveda Reasons 20 MG TABS tablet TAKE 1 TABLET BY MOUTH ONCE DAILY WITH SUPPER 90 tablet 1   No current facility-administered medications on file prior to visit.    Cardiovascular studies:  EKG 02/29/2020: Atrial fibrillation 91 bpm Anteroseptal infarct -age undetermined.  Low voltage  Echocardiogram 07/31/2019: Left ventricle  cavity is normal in size. Moderate concentric hypertrophy of the left ventricle. Normal LV systolic function with EF 55%. Normal global wall motion. Unable to evaluate diastolic function due to atrial fibrillation.  Left atrial cavity is moderately dilated. Mild mitral valve leaflet calcification. Moderate (Grade III) mitral regurgitation. Moderate to severe tricuspid regurgitation. Moderate pulmonary hypertension. Estimated pulmonary artery systolic pressure is 46 mmHg.  IVC is dilated with a respiratory response of <50%. Estimated RA pressure 10-15 mmHg.  EKG 07/25/2019: Atrial fibrillation with controlled vetnricular rate 85 bpm.  EKG 07/20/2019: Atrial fibrillation with controlled ventricular rate 85 bpm.   Recent labs: 01/11/2020: Glucose 88, BUN/Cr 18/0.7. EGFR 70 HbA1C N/A% Chol 132, TG 74, HDL 57, LDL 73 TSH 3.4 normal  07/17/2019: Glucose 88. BUN/Cr 22/0.79. eGFR 70. Na/K 142/4.9. Rest of the CMP normal. H/H 12.8/39.2. MCV 96. Platelets 159. Chol 138, TG 75, HDL 55, LDL 68.    Review of Systems  Cardiovascular: Negative for chest pain, dyspnea on exertion, leg swelling, palpitations and syncope.         Vitals:   04/03/20 0835 04/03/20 0844  BP: (!) 182/93 (!) 172/94  Pulse: 67 81  SpO2: 98% 98%     Body mass index is 26.88 kg/m. Filed Weights   04/03/20 0835  Weight: 171 lb 9.6 oz (77.8 kg)     Objective:   Physical Exam Vitals and nursing  note reviewed.  Constitutional:      General: She is not in acute distress. Neck:     Vascular: No JVD.  Cardiovascular:     Rate and Rhythm: Normal rate and regular rhythm.     Pulses: Intact distal pulses.     Heart sounds: Normal heart sounds. No murmur heard.   Pulmonary:     Effort: Pulmonary effort is normal.     Breath sounds: Normal breath sounds. No wheezing or rales.           Assessment & Recommendations:   85 y.o. Caucasian female with hypertension, hyperlipidemia, persistent atrial  fibrillation, moderate mitral regurgitation  Persistent atrial fibrillation: Rate controlled, likely longstanding.  CHA2DS2VASc score 4, annual; stroke risk 5%. Continue Xarelto 20 mg daily. Patient is completely asymptomatic. I will continue rate control therapy at this time. I do not think rhythm control is warranted, nor is it likely to sustain. Suspicion of ischemia is low, thus not performing stress test at this time.  Continue metoprolol tartrate 25 mg twice daily.  Moderate mitral regurgitation, mild PH: Secondary to Afib. Clinically stable, asymptomatic  Hypertension: Remains uncontrolled.  Changed metoprolol tartarate 25 mg bnid top metoprolol succinate 50 mg daily. Continue amlodipine 5 mg daily, irbesartan 300 mg daily  Hyperlipidemia: Well-controlled.  F/u in 3 months  [place], MD [facility] Pager: [phone] Office: [phone] If no answer Cell [phone]

## 2020-04-03 ENCOUNTER — Ambulatory Visit: Payer: PPO | Admitting: Cardiology

## 2020-04-03 ENCOUNTER — Other Ambulatory Visit: Payer: Self-pay

## 2020-04-03 ENCOUNTER — Encounter: Payer: Self-pay | Admitting: Cardiology

## 2020-04-03 VITALS — BP 172/94 | HR 81 | Ht 67.0 in | Wt 171.6 lb

## 2020-04-03 DIAGNOSIS — I4819 Other persistent atrial fibrillation: Secondary | ICD-10-CM

## 2020-04-03 DIAGNOSIS — I10 Essential (primary) hypertension: Secondary | ICD-10-CM

## 2020-04-03 DIAGNOSIS — E782 Mixed hyperlipidemia: Secondary | ICD-10-CM | POA: Diagnosis not present

## 2020-04-03 MED ORDER — METOPROLOL SUCCINATE ER 100 MG PO TB24: 100.0000 mg | ORAL_TABLET | Freq: Every day | ORAL | 3 refills | Status: DC

## 2020-04-20 DIAGNOSIS — I10 Essential (primary) hypertension: Secondary | ICD-10-CM | POA: Diagnosis not present

## 2020-07-01 ENCOUNTER — Other Ambulatory Visit: Payer: Self-pay | Admitting: Internal Medicine

## 2020-07-01 DIAGNOSIS — Z1231 Encounter for screening mammogram for malignant neoplasm of breast: Secondary | ICD-10-CM

## 2020-07-04 ENCOUNTER — Other Ambulatory Visit: Payer: Self-pay

## 2020-07-04 ENCOUNTER — Ambulatory Visit: Payer: PPO | Admitting: Cardiology

## 2020-07-04 ENCOUNTER — Encounter: Payer: Self-pay | Admitting: Cardiology

## 2020-07-04 VITALS — BP 208/115 | HR 74 | Resp 16 | Ht 67.0 in | Wt 171.0 lb

## 2020-07-04 DIAGNOSIS — E782 Mixed hyperlipidemia: Secondary | ICD-10-CM

## 2020-07-04 DIAGNOSIS — I10 Essential (primary) hypertension: Secondary | ICD-10-CM | POA: Diagnosis not present

## 2020-07-04 DIAGNOSIS — I4819 Other persistent atrial fibrillation: Secondary | ICD-10-CM | POA: Diagnosis not present

## 2020-07-04 NOTE — Progress Notes (Signed)
Patient referred by [name], MD for atrial fibrillation  Subjective:   [name], female    DOB: 04-17-1935, 85 y.o.   MRN: [id number]   Chief Complaint  Patient presents with  . Hypertension  . Follow-up    3 month    85 y.o. Caucasian female with hypertension, hyperlipidemia, persistent atrial fibrillation, moderate mitral regurgitation.  Blood pressures have been labile. Patient attributes these to stress at home related to caregiving for her ailing husband. She recently lost her brother. She denies chest pain, shortness of breath, palpitations, leg edema, orthopnea, PND, TIA/syncope.   Blood pressure elevated today on initial checks, improved subsequently.  Patient reports her blood pressure is always higher in the office, systolic blood pressure as low as 170 mmHg at home.  Also, it appears that she may not have been taking amlodipine.   Current Outpatient Medications on File Prior to Visit  Medication Sig Dispense Refill  . amLODipine (NORVASC) 5 MG tablet Take 5 mg by mouth daily.    . Cholecalciferol (VITAMIN D3) 25 MCG (1000 UT) CAPS Take by mouth daily.    . clonazePAM (KLONOPIN) 0.5 MG tablet Take 0.5 mg by mouth 2 (two) times daily as needed for anxiety.    . Cyanocobalamin (VITAMIN B12 PO) Take by mouth daily.    . irbesartan (AVAPRO) 300 MG tablet Take 300 mg by mouth daily.    . metoprolol succinate (TOPROL-XL) 100 MG 24 hr tablet Take 1 tablet (100 mg total) by mouth daily. Take with or immediately following a meal. 90 tablet 3  . Multiple Vitamins-Minerals (MULTIVITAMIN ADULT PO) Take by mouth daily.    . Potassium 99 MG TABS Take by mouth daily.    . pravastatin (PRAVACHOL) 80 MG tablet Take 80 mg by mouth daily. 1/2 tab daily    . torsemide (DEMADEX) 20 MG tablet Take 20 mg by mouth daily. 1/2 tab daily    . vitamin C (ASCORBIC ACID) 500 MG tablet Take 500 mg by mouth daily.     Alveda Reasons 20 MG TABS tablet TAKE 1 TABLET BY MOUTH ONCE DAILY WITH  SUPPER 90 tablet 1   No current facility-administered medications on file prior to visit.    Cardiovascular studies:  EKG 02/29/2020: Atrial fibrillation 91 bpm Anteroseptal infarct -age undetermined.  Low voltage  Echocardiogram 07/31/2019: Left ventricle cavity is normal in size. Moderate concentric hypertrophy of the left ventricle. Normal LV systolic function with EF 55%. Normal global wall motion. Unable to evaluate diastolic function due to atrial fibrillation.  Left atrial cavity is moderately dilated. Mild mitral valve leaflet calcification. Moderate (Grade III) mitral regurgitation. Moderate to severe tricuspid regurgitation. Moderate pulmonary hypertension. Estimated pulmonary artery systolic pressure is 46 mmHg.  IVC is dilated with a respiratory response of <50%. Estimated RA pressure 10-15 mmHg.  EKG 07/25/2019: Atrial fibrillation with controlled vetnricular rate 85 bpm.  EKG 07/20/2019: Atrial fibrillation with controlled ventricular rate 85 bpm.   Recent labs: 01/11/2020: Glucose 88, BUN/Cr 18/0.7. EGFR 70 HbA1C N/A% Chol 132, TG 74, HDL 57, LDL 73 TSH 3.4 normal  07/17/2019: Glucose 88. BUN/Cr 22/0.79. eGFR 70. Na/K 142/4.9. Rest of the CMP normal. H/H 12.8/39.2. MCV 96. Platelets 159. Chol 138, TG 75, HDL 55, LDL 68.    Review of Systems  Cardiovascular: Negative for chest pain, dyspnea on exertion, leg swelling, palpitations and syncope.  Skin:       Bruising behind left knee  Vitals:   07/04/20 0926 07/04/20 0930  BP: (!) 215/117 (!) 208/115  Pulse: 69 74  Resp: 16   SpO2: 99%      Body mass index is 26.78 kg/m. Filed Weights   07/04/20 0926  Weight: 171 lb (77.6 kg)     Objective:   Physical Exam Vitals and nursing note reviewed.  Constitutional:      General: She is not in acute distress. Neck:     Vascular: No JVD.  Cardiovascular:     Rate and Rhythm: Normal rate. Rhythm irregular.     Pulses: Intact distal pulses.      Heart sounds: Normal heart sounds. No murmur heard.   Pulmonary:     Effort: Pulmonary effort is normal.     Breath sounds: Normal breath sounds. No wheezing or rales.  Skin:    Findings: Bruising (Behind left knee) present.         Assessment & Recommendations:   85 y.o. Caucasian female with hypertension, hyperlipidemia, persistent atrial fibrillation, moderate mitral regurgitation  Persistent atrial fibrillation: Rate controlled, likely longstanding.  CHA2DS2VASc score 4, annual; stroke risk 5%. Continue Xarelto 20 mg daily.  Not significant bruising.  Continue to monitor. Patient is completely asymptomatic. I will continue rate control therapy at this time. I do not think rhythm control is warranted, nor is it likely to sustain. Suspicion of ischemia is low, thus not performing stress test at this time.    Moderate mitral regurgitation, mild PH: Secondary to Afib. Clinically stable, asymptomatic  Hypertension: Remains uncontrolled.  Emphasized compliance with metoprolol succinate 100 mg daily, amlodipine 5 mg daily, irbesartan 300 mg daily.Arranged for remote patient monitoring through pur pharmacist Manuela Schwartz.  Hyperlipidemia: Well-controlled.  F/u in telephone visit in 4 weeks  [place], MD [facility] [place] Pager: [phone] Office: [phone] If no answer Cell [phone]

## 2020-07-18 DIAGNOSIS — R7989 Other specified abnormal findings of blood chemistry: Secondary | ICD-10-CM | POA: Diagnosis not present

## 2020-07-18 DIAGNOSIS — Z0000 Encounter for general adult medical examination without abnormal findings: Secondary | ICD-10-CM | POA: Diagnosis not present

## 2020-07-18 DIAGNOSIS — E559 Vitamin D deficiency, unspecified: Secondary | ICD-10-CM | POA: Diagnosis not present

## 2020-07-18 DIAGNOSIS — E785 Hyperlipidemia, unspecified: Secondary | ICD-10-CM | POA: Diagnosis not present

## 2020-07-18 DIAGNOSIS — I10 Essential (primary) hypertension: Secondary | ICD-10-CM | POA: Diagnosis not present

## 2020-07-21 DIAGNOSIS — I10 Essential (primary) hypertension: Secondary | ICD-10-CM | POA: Diagnosis not present

## 2020-07-25 DIAGNOSIS — I4891 Unspecified atrial fibrillation: Secondary | ICD-10-CM | POA: Diagnosis not present

## 2020-07-25 DIAGNOSIS — E785 Hyperlipidemia, unspecified: Secondary | ICD-10-CM | POA: Diagnosis not present

## 2020-07-25 DIAGNOSIS — F419 Anxiety disorder, unspecified: Secondary | ICD-10-CM | POA: Diagnosis not present

## 2020-07-25 DIAGNOSIS — Z7901 Long term (current) use of anticoagulants: Secondary | ICD-10-CM | POA: Diagnosis not present

## 2020-07-25 DIAGNOSIS — Z0000 Encounter for general adult medical examination without abnormal findings: Secondary | ICD-10-CM | POA: Diagnosis not present

## 2020-07-25 DIAGNOSIS — E038 Other specified hypothyroidism: Secondary | ICD-10-CM | POA: Diagnosis not present

## 2020-07-25 DIAGNOSIS — I10 Essential (primary) hypertension: Secondary | ICD-10-CM | POA: Diagnosis not present

## 2020-07-25 DIAGNOSIS — E559 Vitamin D deficiency, unspecified: Secondary | ICD-10-CM | POA: Diagnosis not present

## 2020-08-05 ENCOUNTER — Encounter: Payer: Self-pay | Admitting: Cardiology

## 2020-08-05 ENCOUNTER — Telehealth: Payer: PPO | Admitting: Cardiology

## 2020-08-05 ENCOUNTER — Other Ambulatory Visit: Payer: Self-pay

## 2020-08-05 VITALS — Ht 67.0 in | Wt 165.0 lb

## 2020-08-05 DIAGNOSIS — E782 Mixed hyperlipidemia: Secondary | ICD-10-CM | POA: Diagnosis not present

## 2020-08-05 DIAGNOSIS — I10 Essential (primary) hypertension: Secondary | ICD-10-CM | POA: Diagnosis not present

## 2020-08-05 DIAGNOSIS — I4819 Other persistent atrial fibrillation: Secondary | ICD-10-CM | POA: Diagnosis not present

## 2020-08-05 NOTE — Progress Notes (Signed)
I connected with the patient on 08/05/2020 by a telephone call and verified that I am speaking with the correct person using two identifiers.   This visit type was conducted due to national recommendations for restrictions regarding the COVID-19 Pandemic (e.g. social distancing).  This format is felt to be most appropriate for this patient at this time.  All issues noted in this document were discussed and addressed.  No physical exam was performed (except for noted visual exam findings with Tele health visits).  The patient has consented to conduct a Tele health visit and understands insurance will be billed.    Patient referred by [name], MD for atrial fibrillation  Subjective:   [name], female    DOB: February 21, 1935, 85 y.o.   MRN: [id number]   Chief Complaint  Patient presents with  . Hypertension  . Follow-up    85 y.o. Caucasian female with hypertension, hyperlipidemia, persistent atrial fibrillation, moderate mitral regurgitation.  Patient is doing well. Home BP readings show Avg BP in 130s/80s, wth occasional spikes in the evening, but much rarer.  Current Outpatient Medications on File Prior to Visit  Medication Sig Dispense Refill  . amLODipine (NORVASC) 5 MG tablet Take 5 mg by mouth daily.    . Cholecalciferol (VITAMIN D3) 25 MCG (1000 UT) CAPS Take by mouth daily.    . clonazePAM (KLONOPIN) 0.5 MG tablet Take 0.5 mg by mouth 2 (two) times daily as needed for anxiety.    . Cyanocobalamin (VITAMIN B12 PO) Take by mouth daily.    . irbesartan (AVAPRO) 300 MG tablet Take 300 mg by mouth daily.    . metoprolol succinate (TOPROL-XL) 100 MG 24 hr tablet Take 1 tablet (100 mg total) by mouth daily. Take with or immediately following a meal. 90 tablet 3  . Multiple Vitamins-Minerals (MULTIVITAMIN ADULT PO) Take by mouth daily.    . Potassium 99 MG TABS Take by mouth daily.    . pravastatin (PRAVACHOL) 80 MG tablet Take 80 mg by mouth daily. 1/2 tab daily    .  torsemide (DEMADEX) 20 MG tablet Take 20 mg by mouth daily. 1/2 tab daily    . vitamin C (ASCORBIC ACID) 500 MG tablet Take 500 mg by mouth daily.     Alveda Reasons 20 MG TABS tablet TAKE 1 TABLET BY MOUTH ONCE DAILY WITH SUPPER 90 tablet 1   No current facility-administered medications on file prior to visit.    Cardiovascular studies:  EKG 02/29/2020: Atrial fibrillation 91 bpm Anteroseptal infarct -age undetermined.  Low voltage  Echocardiogram 07/31/2019: Left ventricle cavity is normal in size. Moderate concentric hypertrophy of the left ventricle. Normal LV systolic function with EF 55%. Normal global wall motion. Unable to evaluate diastolic function due to atrial fibrillation.  Left atrial cavity is moderately dilated. Mild mitral valve leaflet calcification. Moderate (Grade III) mitral regurgitation. Moderate to severe tricuspid regurgitation. Moderate pulmonary hypertension. Estimated pulmonary artery systolic pressure is 46 mmHg.  IVC is dilated with a respiratory response of <50%. Estimated RA pressure 10-15 mmHg.  EKG 07/25/2019: Atrial fibrillation with controlled vetnricular rate 85 bpm.  EKG 07/20/2019: Atrial fibrillation with controlled ventricular rate 85 bpm.   Recent labs: 07/18/2020: Glucose 100, BUN/Cr 20/0.96. EGFR 55. Na/K 143/5.1.  H/H 13/40. MCV 98. Platelets 143 Chol 133, TG 77, HDL 52, LDL 66 TSH 4.3 mildly elevated  01/11/2020: Glucose 88, BUN/Cr 18/0.7. EGFR 70 HbA1C N/A% Chol 132, TG 74, HDL 57, LDL 73 TSH 3.4 normal  07/17/2019: Glucose 88. BUN/Cr 22/0.79. eGFR 70. Na/K 142/4.9. Rest of the CMP normal. H/H 12.8/39.2. MCV 96. Platelets 159. Chol 138, TG 75, HDL 55, LDL 68.    Review of Systems  Cardiovascular: Negative for chest pain, dyspnea on exertion, leg swelling, palpitations and syncope.  Skin:       Bruising behind left knee        See HPI for vitals  Objective:   Physical Exam Vitals and nursing note reviewed.   Constitutional:      General: She is not in acute distress. Neck:     Vascular: No JVD.  Cardiovascular:     Rate and Rhythm: Normal rate. Rhythm irregular.     Pulses: Intact distal pulses.     Heart sounds: Normal heart sounds. No murmur heard.   Pulmonary:     Effort: Pulmonary effort is normal.     Breath sounds: Normal breath sounds. No wheezing or rales.  Skin:    Findings: Bruising (Behind left knee) present.         Assessment & Recommendations:   85 y.o. Caucasian female with hypertension, hyperlipidemia, persistent atrial fibrillation, moderate mitral regurgitation  Persistent atrial fibrillation: Rate controlled, likely longstanding.  CHA2DS2VASc score 4, annual; stroke risk 5%. Continue Xarelto 20 mg daily.  Not significant bruising.  Continue to monitor. Patient is completely asymptomatic. I will continue rate control therapy at this time. I do not think rhythm control is warranted, nor is it likely to sustain. Suspicion of ischemia is low, thus not performing stress test at this time.   Moderate mitral regurgitation, mild PH: Secondary to Afib. Clinically stable, asymptomatic  Hypertension: Well controlled on amlodipine 5 mg, metoprolol succinate 100 mg daily, amlodipine 5 mg daily, irbesartan 300 mg daily.  Hyperlipidemia: Well-controlled.  F/u in 6 months  Manish Esther Hardy, MD [facility] [place] Pager: [phone] Office: [phone] If no answer Cell [phone]

## 2020-08-19 ENCOUNTER — Ambulatory Visit: Payer: PPO

## 2020-08-20 DIAGNOSIS — I10 Essential (primary) hypertension: Secondary | ICD-10-CM | POA: Diagnosis not present

## 2020-08-27 ENCOUNTER — Ambulatory Visit
Admission: RE | Admit: 2020-08-27 | Discharge: 2020-08-27 | Disposition: A | Discharge status: Home / Self Care | Payer: PPO | Source: Ambulatory Visit | Attending: Internal Medicine | Admitting: Internal Medicine

## 2020-08-27 ENCOUNTER — Other Ambulatory Visit: Payer: Self-pay

## 2020-08-27 DIAGNOSIS — Z1231 Encounter for screening mammogram for malignant neoplasm of breast: Secondary | ICD-10-CM | POA: Diagnosis not present

## 2020-09-20 DIAGNOSIS — I10 Essential (primary) hypertension: Secondary | ICD-10-CM | POA: Diagnosis not present

## 2020-10-11 ENCOUNTER — Other Ambulatory Visit: Payer: Self-pay | Admitting: Cardiology

## 2020-11-08 DIAGNOSIS — S92332A Displaced fracture of third metatarsal bone, left foot, initial encounter for closed fracture: Secondary | ICD-10-CM | POA: Diagnosis not present

## 2020-11-09 DIAGNOSIS — S92332A Displaced fracture of third metatarsal bone, left foot, initial encounter for closed fracture: Secondary | ICD-10-CM | POA: Diagnosis not present

## 2020-11-13 ENCOUNTER — Ambulatory Visit: Payer: Self-pay | Admitting: Podiatry

## 2020-11-19 DIAGNOSIS — M7989 Other specified soft tissue disorders: Secondary | ICD-10-CM | POA: Diagnosis not present

## 2020-11-19 DIAGNOSIS — S92332A Displaced fracture of third metatarsal bone, left foot, initial encounter for closed fracture: Secondary | ICD-10-CM | POA: Diagnosis not present

## 2020-11-25 DIAGNOSIS — S92332A Displaced fracture of third metatarsal bone, left foot, initial encounter for closed fracture: Secondary | ICD-10-CM | POA: Diagnosis not present

## 2020-11-25 DIAGNOSIS — M7989 Other specified soft tissue disorders: Secondary | ICD-10-CM | POA: Diagnosis not present

## 2020-12-02 DIAGNOSIS — S92332A Displaced fracture of third metatarsal bone, left foot, initial encounter for closed fracture: Secondary | ICD-10-CM | POA: Diagnosis not present

## 2020-12-25 DIAGNOSIS — T148XXA Other injury of unspecified body region, initial encounter: Secondary | ICD-10-CM | POA: Diagnosis not present

## 2020-12-25 DIAGNOSIS — S91302A Unspecified open wound, left foot, initial encounter: Secondary | ICD-10-CM | POA: Diagnosis not present

## 2020-12-30 ENCOUNTER — Encounter: Payer: PPO | Attending: Physician Assistant | Admitting: Physician Assistant

## 2020-12-30 ENCOUNTER — Other Ambulatory Visit: Payer: Self-pay

## 2020-12-30 DIAGNOSIS — I872 Venous insufficiency (chronic) (peripheral): Secondary | ICD-10-CM | POA: Diagnosis not present

## 2020-12-30 DIAGNOSIS — Z7901 Long term (current) use of anticoagulants: Secondary | ICD-10-CM | POA: Diagnosis not present

## 2020-12-30 DIAGNOSIS — I10 Essential (primary) hypertension: Secondary | ICD-10-CM | POA: Insufficient documentation

## 2020-12-30 DIAGNOSIS — I480 Paroxysmal atrial fibrillation: Secondary | ICD-10-CM | POA: Insufficient documentation

## 2020-12-30 DIAGNOSIS — I482 Chronic atrial fibrillation, unspecified: Secondary | ICD-10-CM | POA: Insufficient documentation

## 2020-12-30 DIAGNOSIS — L97522 Non-pressure chronic ulcer of other part of left foot with fat layer exposed: Secondary | ICD-10-CM | POA: Insufficient documentation

## 2020-12-30 NOTE — Progress Notes (Signed)
[name], GEERS ([id number]) Visit Report for 12/30/2020 Allergy List Details Patient Name: [name]. Date of Service: 12/30/2020 8:30 AM Medical Record Number: [id number] Patient Account Number: [id number] Date of Birth/Sex: 12/18/1934 (86 y.o. F) Treating RN: Donnamarie Poag Primary Care Darlis Wragg: [name] Other Clinician: Jeanine Luz Referring Dannon Nguyenthi: Roland Rack Treating Raif Chachere/Extender: Jeri Cos Weeks in Treatment: 0 Allergies Active Allergies iodine Severity: Severe Allergy Notes Electronic Signature(s) Signed: 12/30/2020 4:36:12 PM By: Donnamarie Poag Entered By: Donnamarie Poag on 12/30/2020 08:36:10 [name] ([id number]) -------------------------------------------------------------------------------- Arrival Information Details Patient Name: [name] Date of Service: 12/30/2020 8:30 AM Medical Record Number: [id number] Patient Account Number: [id number] Date of Birth/Sex: 09-12-1935 (86 y.o. F) Treating RN: Donnamarie Poag Primary Care Shantay Sonn: [name] Other Clinician: Jeanine Luz Referring Mikel Pyon: Roland Rack Treating Allean Montfort/Extender: Skipper Cliche in Treatment: 0 Visit Information Patient Arrived: Ambulatory Arrival Time: 08:33 Accompanied By: self Transfer Assistance: None Patient Identification Verified: Yes Secondary Verification Process Completed: Yes Patient Has Alerts: Yes Patient Alerts: Patient on Blood Thinner TAKES XARELTO NOT diabetic Electronic Signature(s) Signed: 12/30/2020 9:01:10 AM By: Donnamarie Poag Entered By: Donnamarie Poag on 12/30/2020 09:01:10 [name] ([id number]) -------------------------------------------------------------------------------- Clinic Level of Care Assessment Details Patient Name: [name]. Date of Service: 12/30/2020 8:30 AM Medical Record Number: [id number] Patient Account Number: [id number] Date of Birth/Sex: 1935/06/08 (86 y.o. F) Treating RN: Carlene Coria Primary Care Buel Molder: [name] Other Clinician: Jeanine Luz Referring Scottlyn Mchaney: Roland Rack Treating Danelia Snodgrass/Extender: Skipper Cliche in Treatment: 0 Clinic Level of Care Assessment Items TOOL 1 Quantity Score []  - Use when EandM and Procedure is performed on INITIAL visit 0 ASSESSMENTS - Nursing Assessment / Reassessment X - General Physical Exam (combine w/ comprehensive assessment (listed just below) when performed on new 1 20 pt. evals) X- 1 25 Comprehensive Assessment (HX, ROS, Risk Assessments, Wounds Hx, etc.) ASSESSMENTS - Wound and Skin Assessment / Reassessment []  - Dermatologic / Skin Assessment (not related to wound area) 0 ASSESSMENTS - Ostomy and/or Continence Assessment and Care []  - Incontinence Assessment and Management 0 []  - 0 Ostomy Care Assessment and Management (repouching, etc.) PROCESS - Coordination of Care X - Simple Patient / Family Education for ongoing care 1 15 []  - 0 Complex (extensive) Patient / Family Education for ongoing care []  - 0 Staff obtains Programmer, systems, Records, Test Results / Process Orders []  - 0 Staff telephones HHA, Nursing Homes / Clarify orders / etc []  - 0 Routine Transfer to another Facility (non-emergent condition) []  - 0 Routine Hospital Admission (non-emergent condition) X- 1 15 New Admissions / Biomedical engineer / Ordering NPWT, Apligraf, etc. []  - 0 Emergency Hospital Admission (emergent condition) PROCESS - Special Needs []  - Pediatric / Minor Patient Management 0 []  - 0 Isolation Patient Management []  - 0 Hearing / Language / Visual special needs []  - 0 Assessment of Community assistance (transportation, D/C planning, etc.) []  - 0 Additional assistance / Altered mentation []  - 0 Support Surface(s) Assessment (bed, cushion, seat, etc.) INTERVENTIONS - Miscellaneous []  - External ear exam 0 []  - 0 Patient Transfer (multiple staff / Civil Service fast streamer / Similar devices) []  - 0 Simple Staple /  Suture removal (25 or less) []  - 0 Complex Staple / Suture removal (26 or more) []  - 0 Hypo/Hyperglycemic Management (do not check if billed separately) X- 1 15 Ankle / Brachial Index (ABI) - do not check if billed separately Has the patient been seen at the hospital within the last three  years: Yes Total Score: 90 Level Of Care: New/Established - Level 3 JEANY, SEVILLE ([id number]) Electronic Signature(s) Signed: 12/30/2020 5:13:28 PM By: Carlene Coria RN Entered By: Carlene Coria on 12/30/2020 09:37:23 [place], [place] ([id number]) -------------------------------------------------------------------------------- Lower Extremity Assessment Details Patient Name: [name], [name]. Date of Service: 12/30/2020 8:30 AM Medical Record Number: [id number] Patient Account Number: [id number] Date of Birth/Sex: 12-Aug-1935 (86 y.o. F) Treating RN: Donnamarie Poag Primary Care Shaun Zuccaro: [name] Other Clinician: Jeanine Luz Referring Glorianne Proctor: Roland Rack Treating Teiana Hajduk/Extender: Jeri Cos Weeks in Treatment: 0 Edema Assessment Assessed: Shirlyn Goltz: Yes] Patrice Paradise: No] Edema: [Left: N] [Right: o] Calf Left: Right: Point of Measurement: 33 cm From Medial Instep 36 cm Ankle Left: Right: Point of Measurement: 10 cm From Medial Instep 23.3 cm Vascular Assessment Pulses: Dorsalis Pedis Palpable: [Left:Yes] Doppler Audible: [Left:Yes] Blood Pressure: Brachial: [Left:198] Dorsalis Pedis: 170 Ankle: Posterior Tibial: 210 Ankle Brachial Index: [Left:1.06] Electronic Signature(s) Signed: 12/30/2020 4:36:12 PM By: Donnamarie Poag Entered By: Donnamarie Poag on 12/30/2020 08:56:20 [place], [place] ([id number]) -------------------------------------------------------------------------------- Multi Wound Chart Details Patient Name: [name]. Date of Service: 12/30/2020 8:30 AM Medical Record Number: [id number] Patient Account Number: [id number] Date of Birth/Sex: August 15, 1935 (86 y.o.  F) Treating RN: Carlene Coria Primary Care Yitta Gongaware: [name] Other Clinician: Jeanine Luz Referring Amanada Philbrick: Roland Rack Treating Izaih Kataoka/Extender: Skipper Cliche in Treatment: 0 Vital Signs Height(in): 68 Pulse(bpm): 80 Weight(lbs): 163 Blood Pressure(mmHg): 173/127 Body Mass Index(BMI): 25 Temperature(F): 98.1 Respiratory Rate(breaths/min): 18 Photos: [N/A:N/A] Wound Location: Left, Distal, Dorsal Foot Left, Lateral, Dorsal Foot N/A Wounding Event: Gradually Appeared Gradually Appeared N/A Primary Etiology: Pressure Ulcer Pressure Ulcer N/A Comorbid History: Hypertension, Osteoarthritis Hypertension, Osteoarthritis N/A Date Acquired: 11/09/2020 11/09/2020 N/A Weeks of Treatment: 0 0 N/A Wound Status: Open Open N/A Measurements L x W x D (cm) 1.2x2x0.1 0.7x0.9x0.1 N/A Area (cm) : 1.885 0.495 N/A Volume (cm) : 0.188 0.049 N/A % Reduction in Area: 0.00% N/A N/A % Reduction in Volume: 0.00% N/A N/A Classification: Category/Stage II Category/Stage II N/A Exudate Amount: None Present Small N/A Exudate Type: N/A Serosanguineous N/A Exudate Color: N/A red, brown N/A Granulation Amount: None Present (0%) None Present (0%) N/A Necrotic Amount: Large (67-100%) Large (67-100%) N/A Necrotic Tissue: Eschar Adherent Slough N/A Exposed Structures: Fascia: No Fat Layer (Subcutaneous Tissue): N/A Fat Layer (Subcutaneous Tissue): Yes No Fascia: No Tendon: No Tendon: No Muscle: No Muscle: No Joint: No Joint: No Bone: No Bone: No Treatment Notes Electronic Signature(s) Signed: 12/30/2020 5:13:28 PM By: Carlene Coria RN Entered By: Carlene Coria on 12/30/2020 09:20:57 [name] ([id number]) -------------------------------------------------------------------------------- [place] Details Patient Name: [name]. Date of Service: 12/30/2020 8:30 AM Medical Record Number: [id number] Patient Account Number: [id number] Date of Birth/Sex:  1935-08-06 (85 y.o. F) Treating RN: Carlene Coria Primary Care Valory Wetherby: [name] Other Clinician: Jeanine Luz Referring Osha Errico: Roland Rack Treating Jamelle Noy/Extender: Skipper Cliche in Treatment: 0 Active Inactive Abuse / Safety / Falls / Self Care Management Nursing Diagnoses: Potential for falls Goals: Patient will not experience any injury related to falls Date Initiated: 12/30/2020 Target Resolution Date: 01/29/2021 Goal Status: Active Interventions: Assess Activities of Daily Living upon admission and as needed Assess fall risk on admission and as needed Assess: immobility, friction, shearing, incontinence upon admission and as needed Assess impairment of mobility on admission and as needed per policy Assess personal safety and home safety (as indicated) on admission and as needed Assess self care needs on admission and as needed Notes: Wound/Skin Impairment Nursing Diagnoses:  Knowledge deficit related to ulceration/compromised skin integrity Goals: Patient/caregiver will verbalize understanding of skin care regimen Date Initiated: 12/30/2020 Target Resolution Date: 01/29/2021 Goal Status: Active Ulcer/skin breakdown will have a volume reduction of 30% by week 4 Date Initiated: 12/30/2020 Target Resolution Date: 01/29/2021 Goal Status: Active Ulcer/skin breakdown will have a volume reduction of 50% by week 8 Date Initiated: 12/30/2020 Target Resolution Date: 03/01/2021 Goal Status: Active Ulcer/skin breakdown will have a volume reduction of 80% by week 12 Date Initiated: 12/30/2020 Target Resolution Date: 03/31/2021 Goal Status: Active Ulcer/skin breakdown will heal within 14 weeks Date Initiated: 12/30/2020 Target Resolution Date: 05/01/2021 Goal Status: Active Interventions: Assess patient/caregiver ability to obtain necessary supplies Assess patient/caregiver ability to perform ulcer/skin care regimen upon admission and as needed Assess ulceration(s) every  visit Notes: Electronic Signature(s) Signed: 12/30/2020 5:13:28 PM By: Carlene Coria RN Entered By: Carlene Coria on 12/30/2020 09:19:38 [place], [place] ([id number] Nelson ([id number]) -------------------------------------------------------------------------------- Pain Assessment Details Patient Name: [name]. Date of Service: 12/30/2020 8:30 AM Medical Record Number: [id number] Patient Account Number: [id number] Date of Birth/Sex: 10-Mar-1935 (85 y.o. F) Treating RN: Donnamarie Poag Primary Care Ruie Sendejo: [name] Other Clinician: Jeanine Luz Referring Carime Dinkel: Roland Rack Treating Martita Brumm/Extender: Skipper Cliche in Treatment: 0 Active Problems Location of Pain Severity and Description of Pain Patient Has Paino No Site Locations Rate the pain. Current Pain Level: 0 Pain Management and Medication Current Pain Management: Notes states none at this time Electronic Signature(s) Signed: 12/30/2020 4:36:12 PM By: Donnamarie Poag Entered By: Donnamarie Poag on 12/30/2020 08:34:25 [name] ([id number]) -------------------------------------------------------------------------------- Patient/Caregiver Education Details Patient Name: [name]. Date of Service: 12/30/2020 8:30 AM Medical Record Number: [id number] Patient Account Number: [id number] Date of Birth/Gender: 1935/06/11 (85 y.o. F) Treating RN: Carlene Coria Primary Care Physician: [name] Other Clinician: Jeanine Luz Referring Physician: Roland Rack Treating Physician/Extender: Skipper Cliche in Treatment: 0 Education Assessment Education Provided To: Patient Education Topics Provided Wound/Skin Impairment: Methods: Explain/Verbal Responses: State content correctly Electronic Signature(s) Signed: 12/30/2020 5:13:28 PM By: Carlene Coria RN Entered By: Carlene Coria on 12/30/2020 09:37:49 [place], [place]  ([id number]) -------------------------------------------------------------------------------- Wound Assessment Details Patient Name: [name] Date of Service: 12/30/2020 8:30 AM Medical Record Number: [id number] Patient Account Number: [id number] Date of Birth/Sex: 1935/06/12 (85 y.o. F) Treating RN: Donnamarie Poag Primary Care Aleksi Brummet: [name] Other Clinician: Jeanine Luz Referring Tiara Maultsby: Roland Rack Treating Yehudah Standing/Extender: Skipper Cliche in Treatment: 0 Wound Status Wound Number: 1 Primary Etiology: Venous Leg Ulcer Wound Location: Left, Distal, Dorsal Foot Wound Status: Open Wounding Event: Gradually Appeared Comorbid History: Hypertension, Osteoarthritis Date Acquired: 11/09/2020 Weeks Of Treatment: 0 Clustered Wound: No Photos Wound Measurements Length: (cm) 1.2 Width: (cm) 2 Depth: (cm) 0.1 Area: (cm) 1.885 Volume: (cm) 0.188 % Reduction in Area: 0% % Reduction in Volume: 0% Epithelialization: None Tunneling: No Undermining: No Wound Description Classification: Full Thickness Without Exposed Support Structu Exudate Amount: Medium Exudate Type: Serosanguineous Exudate Color: red, brown res Foul Odor After Cleansing: No Slough/Fibrino Yes Wound Bed Granulation Amount: None Present (0%) Exposed Structure Necrotic Amount: Large (67-100%) Fascia Exposed: No Necrotic Quality: Eschar Fat Layer (Subcutaneous Tissue) Exposed: No Tendon Exposed: No Muscle Exposed: No Joint Exposed: No Bone Exposed: No Electronic Signature(s) Signed: 12/30/2020 4:36:12 PM By: Donnamarie Poag Signed: 12/30/2020 5:13:28 PM By: Carlene Coria RN Entered By: Carlene Coria on 12/30/2020 09:32:28 [name] ([id number]) -------------------------------------------------------------------------------- Wound Assessment Details Patient Name: [name]. Date of Service: 12/30/2020 8:30 AM Medical  Record Number: [id number] Patient Account Number:  [id number] Date of Birth/Sex: 1935/09/13 (85 y.o. F) Treating RN: Donnamarie Poag Primary Care Alleen Kehm: [name] Other Clinician: Jeanine Luz Referring Ceira Hoeschen: Roland Rack Treating Catera Hankins/Extender: Skipper Cliche in Treatment: 0 Wound Status Wound Number: 2 Primary Etiology: Venous Leg Ulcer Wound Location: Left, Lateral, Dorsal Foot Wound Status: Open Wounding Event: Gradually Appeared Comorbid History: Hypertension, Osteoarthritis Date Acquired: 11/09/2020 Weeks Of Treatment: 0 Clustered Wound: No Photos Wound Measurements Length: (cm) 0.7 Width: (cm) 0.9 Depth: (cm) 0.1 Area: (cm) 0.495 Volume: (cm) 0.049 % Reduction in Area: 0% % Reduction in Volume: 0% Epithelialization: None Tunneling: No Undermining: No Wound Description Classification: Full Thickness Without Exposed Support Structu Exudate Amount: Medium Exudate Type: Serosanguineous Exudate Color: red, brown res Foul Odor After Cleansing: No Slough/Fibrino Yes Wound Bed Granulation Amount: None Present (0%) Exposed Structure Necrotic Amount: Large (67-100%) Fascia Exposed: No Necrotic Quality: Adherent Slough Fat Layer (Subcutaneous Tissue) Exposed: Yes Tendon Exposed: No Muscle Exposed: No Joint Exposed: No Bone Exposed: No Electronic Signature(s) Signed: 12/30/2020 4:36:12 PM By: Donnamarie Poag Signed: 12/30/2020 5:13:28 PM By: Carlene Coria RN Entered By: Carlene Coria on 12/30/2020 09:32:15 [name] ([id number]) -------------------------------------------------------------------------------- Vitals Details Patient Name: [name]. Date of Service: 12/30/2020 8:30 AM Medical Record Number: [id number] Patient Account Number: [id number] Date of Birth/Sex: [birth year] (85 y.o. F) Treating RN: Donnamarie Poag Primary Care Janelis Stelzer: [name] Other Clinician: Jeanine Luz Referring Meyer Arora: Roland Rack Treating Dameer Speiser/Extender: Skipper Cliche in Treatment: 0 Vital  Signs Time Taken: 08:34 Temperature (F): 98.1 Height (in): 68 Pulse (bpm): 80 Source: Stated Respiratory Rate (breaths/min): 18 Weight (lbs): 163 Blood Pressure (mmHg): 173/127 Body Mass Index (BMI): 24.8 Reference Range: 80 - 120 mg / dl Electronic Signature(s) Signed: 12/30/2020 4:36:12 PM By: Donnamarie Poag Entered ByDonnamarie Poag on 12/30/2020 08:35:00

## 2020-12-30 NOTE — Progress Notes (Signed)
HOLLI, RENGEL ([id number]) Visit Report for 12/30/2020 Abuse/Suicide Risk Screen Details Patient Name: [name], [name]. Date of Service: 12/30/2020 8:30 AM Medical Record Number: [id number] Patient Account Number: [id number] Date of Birth/Sex: Jan 29, 1935 (86 y.o. F) Treating RN: Donnamarie Poag Primary Care Freeland Pracht: [name] Other Clinician: Jeanine Luz Referring Khing Belcher: Roland Rack Treating Keirsten Matuska/Extender: Skipper Cliche in Treatment: 0 Abuse/Suicide Risk Screen Items Answer ABUSE RISK SCREEN: Has anyone close to you tried to hurt or harm you recentlyo No Do you feel uncomfortable with anyone in your familyo No Has anyone forced you do things that you didnot want to doo No Electronic Signature(s) Signed: 12/30/2020 4:36:12 PM By: Donnamarie Poag Entered By: Donnamarie Poag on 12/30/2020 08:42:32 [place], [place] ([id number]) -------------------------------------------------------------------------------- Activities of Daily Living Details Patient Name: Sylvia, Nelson. Date of Service: 12/30/2020 8:30 AM Medical Record Number: [id number] Patient Account Number: [id number] Date of Birth/Sex: [birth year] (86 y.o. F) Treating RN: Donnamarie Poag Primary Care Caliana Spires: [name] Other Clinician: Jeanine Luz Referring Jarek Longton: Roland Rack Treating Jlen Wintle/Extender: Skipper Cliche in Treatment: 0 Activities of Daily Living Items Answer Activities of Daily Living (Please select one for each item) Drive Automobile Completely Able Take Medications Completely Able Use Telephone Completely Able Care for Appearance Completely Able Use Toilet Completely Able Bath / Shower Completely Able Dress Self Completely Able Feed Self Completely Able Walk Completely Able Get In / Out Bed Completely Able Housework Completely Able Prepare Meals Completely Able Handle Money Completely Able Shop for Self Completely Able Electronic Signature(s) Signed: 12/30/2020 4:36:12 PM By:  Donnamarie Poag Entered By: Donnamarie Poag on 12/30/2020 08:43:07 [name] ([id number]) -------------------------------------------------------------------------------- Education Screening Details Patient Name: [name] Date of Service: 12/30/2020 8:30 AM Medical Record Number: [id number] Patient Account Number: [id number] Date of Birth/Sex: October 08, 1934 (86 y.o. F) Treating RN: Donnamarie Poag Primary Care Shaunda Tipping: [name] Other Clinician: Jeanine Luz Referring Cambell Stanek: Roland Rack Treating Ulmer Degen/Extender: Skipper Cliche in Treatment: 0 Learning Preferences/Education Level/Primary Language Learning Preference: Explanation Highest Education Level: College or Above Preferred Language: English Cognitive Barrier Language Barrier: No Translator Needed: No Memory Deficit: No Emotional Barrier: No Cultural/Religious Beliefs Affecting Medical Care: No Physical Barrier Impaired Vision: No Impaired Hearing: No Decreased Hand dexterity: No Knowledge/Comprehension Knowledge Level: High Comprehension Level: High Ability to understand written instructions: High Ability to understand verbal instructions: High Motivation Anxiety Level: Calm Cooperation: Cooperative Education Importance: Acknowledges Need Interest in Health Problems: Asks Questions Perception: Coherent Willingness to Engage in Self-Management High Activities: Readiness to Engage in Self-Management High Activities: Electronic Signature(s) Signed: 12/30/2020 4:36:12 PM By: Donnamarie Poag Entered By: Donnamarie Poag on 12/30/2020 08:43:36 [place], [place] ([id number]) -------------------------------------------------------------------------------- Fall Risk Assessment Details Patient Name: [name] Date of Service: 12/30/2020 8:30 AM Medical Record Number: [id number] Patient Account Number: [id number] Date of Birth/Sex: 11/24/1934 (86 y.o. F) Treating RN: Donnamarie Poag Primary Care Granite Godman:  [name] Other Clinician: Jeanine Luz Referring Blayne Frankie: Roland Rack Treating Jamicia Haaland/Extender: Skipper Cliche in Treatment: 0 Fall Risk Assessment Items Have you had 2 or more falls in the last 12 monthso 0 No Have you had any fall that resulted in injury in the last 12 monthso 0 No FALLS RISK SCREEN History of falling - immediate or within 3 months 25 Yes Secondary diagnosis (Do you have 2 or more medical diagnoseso) 0 No Ambulatory aid None/bed rest/wheelchair/nurse 0 Yes Crutches/cane/walker 0 No Furniture 0 No Intravenous therapy Access/Saline/Heparin Lock 0 No Gait/Transferring Normal/ bed rest/ wheelchair 0 No Weak (short steps  with or without shuffle, stooped but able to lift head while walking, may 10 Yes seek support from furniture) Impaired (short steps with shuffle, may have difficulty arising from chair, head down, impaired 0 No balance) Mental Status Oriented to own ability 0 Yes Electronic Signature(s) Signed: 12/30/2020 4:36:12 PM By: Donnamarie Poag Entered By: Donnamarie Poag on 12/30/2020 08:44:00 [name] ([id number]) -------------------------------------------------------------------------------- Foot Assessment Details Patient Name: [name]. Date of Service: 12/30/2020 8:30 AM Medical Record Number: [id number] Patient Account Number: [id number] Date of Birth/Sex: 07/05/1935 (86 y.o. F) Treating RN: Donnamarie Poag Primary Care Latonga Ponder: [name] Other Clinician: Jeanine Luz Referring Mackensi Mahadeo: Roland Rack Treating Sanaya Gwilliam/Extender: Skipper Cliche in Treatment: 0 Foot Assessment Items Site Locations + = Sensation present, - = Sensation absent, C = Callus, U = Ulcer R = Redness, W = Warmth, M = Maceration, PU = Pre-ulcerative lesion F = Fissure, S = Swelling, D = Dryness Assessment Right: Left: Other Deformity: No No Prior Foot Ulcer: No No Prior Amputation: No No Charcot Joint: No No Ambulatory Status: Ambulatory  Without Help Gait: Steady Electronic Signature(s) Signed: 12/30/2020 4:36:12 PM By: Donnamarie Poag Entered By: Donnamarie Poag on 12/30/2020 08:56:56 [place], [place] ([id number]) -------------------------------------------------------------------------------- Nutrition Risk Screening Details Patient Name: [name]. Date of Service: 12/30/2020 8:30 AM Medical Record Number: [id number] Patient Account Number: [id number] Date of Birth/Sex: Feb 27, 1935 (86 y.o. F) Treating RN: Donnamarie Poag Primary Care Navin Dogan: [name] Other Clinician: Jeanine Luz Referring Rhyen Mazariego: Roland Rack Treating Rami Waddle/Extender: Skipper Cliche in Treatment: 0 Height (in): 68 Weight (lbs): 163 Body Mass Index (BMI): 24.8 Nutrition Risk Screening Items Score Screening NUTRITION RISK SCREEN: I have an illness or condition that made me change the kind and/or amount of food I eat 0 No I eat fewer than two meals per day 0 No I eat few fruits and vegetables, or milk products 0 No I have three or more drinks of beer, liquor or wine almost every day 0 No I have tooth or mouth problems that make it hard for me to eat 0 No I don't always have enough money to buy the food I need 0 No I eat alone most of the time 0 No I take three or more different prescribed or over-the-counter drugs a day 1 Yes Without wanting to, I have lost or gained 10 pounds in the last six months 0 No I am not always physically able to shop, cook and/or feed myself 0 No Nutrition Protocols Good Risk Protocol 0 No interventions needed Moderate Risk Protocol High Risk Proctocol Risk Level: Good Risk Score: 1 Electronic Signature(s) Signed: 12/30/2020 4:36:12 PM By: Donnamarie Poag Entered ByDonnamarie Poag on 12/30/2020 08:44:31

## 2020-12-30 NOTE — Progress Notes (Signed)
[name], [name] ([id number]) Visit Report for 12/30/2020 Chief Complaint Document Details Patient Name: [name], [name]. Date of Service: 12/30/2020 8:30 AM Medical Record Number: [id number] Patient Account Number: [id number] Date of Birth/Sex: 1935/06/29 (86 y.o. F) Treating RN: Carlene Coria Primary Care Provider: [name] Other Clinician: Jeanine Luz Referring Provider: Roland Rack Treating Provider/Extender: Skipper Cliche in Treatment: 0 Information Obtained from: Patient Chief Complaint Left foot ulcers Electronic Signature(s) Signed: 12/30/2020 9:11:15 AM By: Worthy Keeler PA-C Entered By: Worthy Keeler on 12/30/2020 09:11:15 [name] ([id number]) -------------------------------------------------------------------------------- Debridement Details Patient Name: [name] Date of Service: 12/30/2020 8:30 AM Medical Record Number: [id number] Patient Account Number: [id number] Date of Birth/Sex: [birth year] (86 y.o. F) Treating RN: Carlene Coria Primary Care Provider: [name] Other Clinician: Jeanine Luz Referring Provider: Roland Rack Treating Provider/Extender: Skipper Cliche in Treatment: 0 Debridement Performed for Wound #1 Left,Distal,Dorsal Foot Assessment: Performed By: Physician Tommie Sams., PA-C Debridement Type: Debridement Severity of Tissue Pre Debridement: Fat layer exposed Level of Consciousness (Pre- Awake and Alert procedure): Pre-procedure Verification/Time Out Yes - 09:19 Taken: Start Time: 09:19 Pain Control: Lidocaine 4% Topical Solution Total Area Debrided (L x W): 1.2 (cm) x 2 (cm) = 2.4 (cm) Tissue and other material Non-Viable, Eschar debrided: Level: Non-Viable Tissue Debridement Description: Selective/Open Wound Instrument: Curette Bleeding: None End Time: 09:25 Procedural Pain: 0 Post Procedural Pain: 0 Response to Treatment: Procedure was tolerated well Level of Consciousness  (Post- Awake and Alert procedure): Post Debridement Measurements of Total Wound Length: (cm) 0.3 Width: (cm) 0.3 Depth: (cm) 0.1 Volume: (cm) 0.007 Character of Wound/Ulcer Post Debridement: Improved Severity of Tissue Post Debridement: Fat layer exposed Post Procedure Diagnosis Same as Pre-procedure Electronic Signature(s) Signed: 12/30/2020 4:31:50 PM By: Worthy Keeler PA-C Signed: 12/30/2020 5:13:28 PM By: Carlene Coria RN Entered By: Carlene Coria on 12/30/2020 09:22:48 [name] ([id number]) -------------------------------------------------------------------------------- Debridement Details Patient Name: [name]. Date of Service: 12/30/2020 8:30 AM Medical Record Number: [id number] Patient Account Number: [id number] Date of Birth/Sex: 1935-05-31 (86 y.o. F) Treating RN: Carlene Coria Primary Care Provider: [name] Other Clinician: Jeanine Luz Referring Provider: Roland Rack Treating Provider/Extender: Skipper Cliche in Treatment: 0 Debridement Performed for Wound #2 Left,Lateral,Dorsal Foot Assessment: Performed By: Physician Tommie Sams., PA-C Debridement Type: Debridement Severity of Tissue Pre Debridement: Fat layer exposed Level of Consciousness (Pre- Awake and Alert procedure): Pre-procedure Verification/Time Out Yes - 09:19 Taken: Start Time: 09:19 Pain Control: Lidocaine 4% Topical Solution Total Area Debrided (L x W): 0.3 (cm) x 0.3 (cm) = 0.09 (cm) Tissue and other material Non-Viable, Skin: Dermis , Skin: Epidermis debrided: Level: Skin/Epidermis Debridement Description: Selective/Open Wound Instrument: Curette Bleeding: None End Time: 09:25 Procedural Pain: 0 Post Procedural Pain: 0 Response to Treatment: Procedure was tolerated well Level of Consciousness (Post- Awake and Alert procedure): Post Debridement Measurements of Total Wound Length: (cm) 1 Width: (cm) 1 Depth: (cm) 0.1 Volume: (cm) 0.079 Character  of Wound/Ulcer Post Debridement: Improved Severity of Tissue Post Debridement: Fat layer exposed Post Procedure Diagnosis Same as Pre-procedure Electronic Signature(s) Signed: 12/30/2020 4:31:50 PM By: Worthy Keeler PA-C Signed: 12/30/2020 5:13:28 PM By: Carlene Coria RN Entered By: Carlene Coria on 12/30/2020 09:24:50 [name], [name] ([id number]) -------------------------------------------------------------------------------- HPI Details Patient Name: [name] Date of Service: 12/30/2020 8:30 AM Medical Record Number: [id number] Patient Account Number: [id number] Date of Birth/Sex: 06-20-1935 (86 y.o. F) Treating RN: Carlene Coria Primary Care Provider: [name] Other Clinician: Jeanine Luz  Referring Provider: Roland Rack Treating Provider/Extender: Skipper Cliche in Treatment: 0 History of Present Illness HPI Description: 12/30/2020 upon evaluation today patient presents for initial inspection here in the clinic concerning issues she has been having with wounds over the left foot since she sustained a fall in February 2022. Subsequently she does have what appears to be mild chronic venous stasis just looking at her legs bilaterally with that being said following the fall she did have a fractured bone in her foot as well as spraining her ankle which has caused increased swelling and I think this is led to venous ulcerations. Fortunately there does not appear to be any signs of active infection at this time. No fevers, chills, nausea, vomiting, or diarrhea. The patient is currently on Xarelto as a blood thinner due to chronic atrial fibrillation. She also does have hypertension. Fortunately she has no other major medical problems she does take care of her 41 year old husband who requires a lot of help she tells me therefore she is on her feet a lot. She has been trying to elevate her legs however which has also helped some with the swelling. She is not able to get  compression socks on. Electronic Signature(s) Signed: 12/30/2020 9:33:51 AM By: Worthy Keeler PA-C Entered By: Worthy Keeler on 12/30/2020 09:33:50 [name], [name] ([id number]) -------------------------------------------------------------------------------- Physical Exam Details Patient Name: [name], [name]. Date of Service: 12/30/2020 8:30 AM Medical Record Number: [id number] Patient Account Number: [id number] Date of Birth/Sex: 20-Nov-1934 (86 y.o. F) Treating RN: Carlene Coria Primary Care Provider: [name] Other Clinician: Jeanine Luz Referring Provider: Roland Rack Treating Provider/Extender: Skipper Cliche in Treatment: 0 Constitutional patient is hypertensive.. pulse regular and within target range for patient.Marland Kitchen respirations regular, non-labored and within target range for patient.Marland Kitchen temperature within target range for patient.. Well-nourished and well-hydrated in no acute distress. Eyes conjunctiva clear no eyelid edema noted. pupils equal round and reactive to light and accommodation. Ears, Nose, Mouth, and Throat no gross abnormality of ear auricles or external auditory canals. normal hearing noted during conversation. mucus membranes moist. Respiratory normal breathing without difficulty. Cardiovascular 1+ dorsalis pedis/posterior tibialis pulses. 1+ pitting edema of the bilateral lower extremities. Musculoskeletal normal gait and posture. no significant deformity or arthritic changes, no loss or range of motion, no clubbing. Psychiatric this patient is able to make decisions and demonstrates good insight into disease process. Alert and Oriented x 3. pleasant and cooperative. Notes Upon inspection patient's wounds x-ray showed signs of good granulation in some areas there was some eschar and others and new blister in one area more proximal. Subsequently I did perform sharp debridement to clear away some of the necrotic debris here in general and the  patient tolerated this today without complication posttreatment wound bed appears to be doing better which is great news. There does not appear to be any signs of active infection which is also great news. Electronic Signature(s) Signed: 12/30/2020 9:34:30 AM By: Worthy Keeler PA-C Entered By: Worthy Keeler on 12/30/2020 09:34:30 [name], [name] ([id number]) -------------------------------------------------------------------------------- Physician Orders Details Patient Name: RAYELLE, ARMOR. Date of Service: 12/30/2020 8:30 AM Medical Record Number: [id number] Patient Account Number: [id number] Date of Birth/Sex: Aug 03, 1935 (86 y.o. F) Treating RN: Carlene Coria Primary Care Provider: [name] Other Clinician: Jeanine Luz Referring Provider: Roland Rack Treating Provider/Extender: Skipper Cliche in Treatment: 0 Verbal / Phone Orders: No Diagnosis Coding ICD-10 Coding Code Description I87.2 Venous insufficiency (chronic) (peripheral) L97.522 Non-pressure chronic ulcer of  other part of left foot with fat layer exposed [place] (primary) hypertension I48.0 Paroxysmal atrial fibrillation Z79.01 Long term (current) use of anticoagulants Follow-up Appointments o Return Appointment in 1 week. Bathing/ Shower/ Hygiene o May shower with wound dressing protected with water repellent cover or cast protector. Edema Control - Lymphedema / Segmental Compressive Device / Other o Elevate, Exercise Daily and Avoid Standing for Long Periods of Time. o Elevate legs to the level of the heart and pump ankles as often as possible o Elevate leg(s) parallel to the floor when sitting. Wound Treatment Wound #1 - Foot Wound Laterality: Dorsal, Left, Distal Cleanser: Soap and Water (Generic) 1 x Per Week/30 Days Discharge Instructions: Gently cleanse wound with antibacterial soap, rinse and pat dry prior to dressing wounds Primary Dressing: Silvercel Small 2x2 (in/in)  (Generic) 1 x Per Week/30 Days Discharge Instructions: Apply Silvercel Small 2x2 (in/in) as instructed Secondary Dressing: ABD Pad 5x9 (in/in) (Generic) 1 x Per Week/30 Days Discharge Instructions: Cover with ABD pad Compression Wrap: Profore LF 4 Multi-Layer Compression Bandaging System (Generic) 1 x Per Week/30 Days Discharge Instructions: Apply 4 multi-layer wrap as directed. Compression Stockings: Jobst Farrow Wrap 4000 (DME) Left Leg Compression Amount: 30-40 mmHG Discharge Instructions: Apply to lower extremity as directed. Wound #2 - Foot Wound Laterality: Dorsal, Left, Lateral Cleanser: Soap and Water (Generic) 1 x Per Week/30 Days Discharge Instructions: Gently cleanse wound with antibacterial soap, rinse and pat dry prior to dressing wounds Primary Dressing: Silvercel Small 2x2 (in/in) (Generic) 1 x Per Week/30 Days Discharge Instructions: Apply Silvercel Small 2x2 (in/in) as instructed Secondary Dressing: ABD Pad 5x9 (in/in) (Generic) 1 x Per Week/30 Days Discharge Instructions: Cover with ABD pad Compression Wrap: Profore LF 4 Multi-Layer Compression Bandaging System (Generic) 1 x Per Week/30 Days Discharge Instructions: Apply 4 multi-layer wrap as directed. ZAKIRAH, WEINGART ([id number]) Electronic Signature(s) Signed: 12/30/2020 4:31:50 PM By: Worthy Keeler PA-C Signed: 12/30/2020 5:13:28 PM By: Carlene Coria RN Entered By: Carlene Coria on 12/30/2020 09:36:49 [name], [name] ([id number]) -------------------------------------------------------------------------------- Problem List Details Patient Name: [name], [name]. Date of Service: 12/30/2020 8:30 AM Medical Record Number: [id number] Patient Account Number: [id number] Date of Birth/Sex: 03-22-1935 (86 y.o. F) Treating RN: Carlene Coria Primary Care Provider: [name] Other Clinician: Jeanine Luz Referring Provider: Roland Rack Treating Provider/Extender: Skipper Cliche in Treatment: 0 Active  Problems ICD-10 Encounter Code Description Active Date MDM Diagnosis I87.2 Venous insufficiency (chronic) (peripheral) 12/30/2020 No Yes L97.522 Non-pressure chronic ulcer of other part of left foot with fat layer 12/30/2020 No Yes exposed [place] (primary) hypertension 12/30/2020 No Yes I48.0 Paroxysmal atrial fibrillation 12/30/2020 No Yes Z79.01 Long term (current) use of anticoagulants 12/30/2020 No Yes Inactive Problems Resolved Problems Electronic Signature(s) Signed: 12/30/2020 9:30:24 AM By: Worthy Keeler PA-C Previous Signature: 12/30/2020 9:11:00 AM Version By: Worthy Keeler PA-C Entered By: Worthy Keeler on 12/30/2020 09:30:24 Daws, [name] ([id number]) -------------------------------------------------------------------------------- Progress Note Details Patient Name: [name] Date of Service: 12/30/2020 8:30 AM Medical Record Number: [id number] Patient Account Number: [id number] Date of Birth/Sex: [birth year] (86 y.o. F) Treating RN: Carlene Coria Primary Care Provider: [name] Other Clinician: Jeanine Luz Referring Provider: Roland Rack Treating Provider/Extender: Skipper Cliche in Treatment: 0 Subjective Chief Complaint Information obtained from Patient Left foot ulcers History of Present Illness (HPI) 12/30/2020 upon evaluation today patient presents for initial inspection here in the clinic concerning issues she has been having with wounds over the left foot since she sustained a  fall in February 2022. Subsequently she does have what appears to be mild chronic venous stasis just looking at her legs bilaterally with that being said following the fall she did have a fractured bone in her foot as well as spraining her ankle which has caused increased swelling and I think this is led to venous ulcerations. Fortunately there does not appear to be any signs of active infection at this time. No fevers, chills, nausea, vomiting, or diarrhea.  The patient is currently on Xarelto as a blood thinner due to chronic atrial fibrillation. She also does have hypertension. Fortunately she has no other major medical problems she does take care of her 59 year old husband who requires a lot of help she tells me therefore she is on her feet a lot. She has been trying to elevate her legs however which has also helped some with the swelling. She is not able to get compression socks on. Patient History Information obtained from Patient. Allergies iodine (Severity: Severe) Family History Cancer - Child, Heart Disease - Mother,Maternal Grandparents, Hypertension - Mother,Paternal Grandparents,Maternal Grandparents, Kidney Disease - Siblings, Stroke - Mother, No family history of Diabetes, Hereditary Spherocytosis, Lung Disease, Seizures, Thyroid Problems, Tuberculosis. Social History Former smoker, Marital Status - Married, Alcohol Use - Never, Drug Use - No History, Caffeine Use - Never. Medical History Cardiovascular Patient has history of Hypertension - Afib Musculoskeletal Patient has history of Osteoarthritis Hospitalization/Surgery History - hysterectomy. - choclecystectomy. - tonsillectomy. Review of Systems (ROS) Constitutional Symptoms (General Health) Denies complaints or symptoms of Fatigue, Fever, Chills, Marked Weight Change. Eyes Complains or has symptoms of Glasses / Contacts. Ear/Nose/Mouth/Throat Denies complaints or symptoms of Difficult clearing ears, Sinusitis. Hematologic/Lymphatic Denies complaints or symptoms of Bleeding / Clotting Disorders, Human Immunodeficiency Virus. Respiratory Denies complaints or symptoms of Chronic or frequent coughs, Shortness of Breath. Gastrointestinal Denies complaints or symptoms of Frequent diarrhea, Nausea, Vomiting. Endocrine Denies complaints or symptoms of Hepatitis, Thyroid disease, Polydypsia (Excessive Thirst). Genitourinary Denies complaints or symptoms of Kidney failure/  Dialysis, Incontinence/dribbling. Immunological Denies complaints or symptoms of Hives, Itching. Integumentary (Skin) Complains or has symptoms of Bleeding or bruising tendency - xarelto. Neurologic Denies complaints or symptoms of Numbness/parasthesias, Focal/Weakness. Psychiatric Complains or has symptoms of Anxiety - in the past. Denies complaints or symptoms of Claustrophobia. DARCY, [place] ([id number]) Objective Constitutional patient is hypertensive.. pulse regular and within target range for patient.Marland Kitchen respirations regular, non-labored and within target range for patient.Marland Kitchen temperature within target range for patient.. Well-nourished and well-hydrated in no acute distress. Vitals Time Taken: 8:34 AM, Height: 68 in, Source: Stated, Weight: 163 lbs, BMI: 24.8, Temperature: 98.1 F, Pulse: 80 bpm, Respiratory Rate: 18 breaths/min, Blood Pressure: 173/127 mmHg. Eyes conjunctiva clear no eyelid edema noted. pupils equal round and reactive to light and accommodation. Ears, Nose, Mouth, and Throat no gross abnormality of ear auricles or external auditory canals. normal hearing noted during conversation. mucus membranes moist. Respiratory normal breathing without difficulty. Cardiovascular 1+ dorsalis pedis/posterior tibialis pulses. 1+ pitting edema of the bilateral lower extremities. Musculoskeletal normal gait and posture. no significant deformity or arthritic changes, no loss or range of motion, no clubbing. Psychiatric this patient is able to make decisions and demonstrates good insight into disease process. Alert and Oriented x 3. pleasant and cooperative. General Notes: Upon inspection patient's wounds x-ray showed signs of good granulation in some areas there was some eschar and others and new blister in one area more proximal. Subsequently I did perform sharp debridement to clear away some  of the necrotic debris here in general and the patient tolerated this today without  complication posttreatment wound bed appears to be doing better which is great news. There does not appear to be any signs of active infection which is also great news. Integumentary (Hair, Skin) Wound #1 status is Open. Original cause of wound was Gradually Appeared. The date acquired was: 11/09/2020. The wound is located on the Left,Distal,Dorsal Foot. The wound measures 1.2cm length x 2cm width x 0.1cm depth; 1.885cm^2 area and 0.188cm^3 volume. There is no tunneling or undermining noted. There is a medium amount of serosanguineous drainage noted. There is no granulation within the wound bed. There is a large (67-100%) amount of necrotic tissue within the wound bed including Eschar. Wound #2 status is Open. Original cause of wound was Gradually Appeared. The date acquired was: 11/09/2020. The wound is located on the Left,Lateral,Dorsal Foot. The wound measures 0.7cm length x 0.9cm width x 0.1cm depth; 0.495cm^2 area and 0.049cm^3 volume. There is Fat Layer (Subcutaneous Tissue) exposed. There is no tunneling or undermining noted. There is a medium amount of serosanguineous drainage noted. There is no granulation within the wound bed. There is a large (67-100%) amount of necrotic tissue within the wound bed including Adherent Slough. Assessment Active Problems ICD-10 Venous insufficiency (chronic) (peripheral) Non-pressure chronic ulcer of other part of left foot with fat layer exposed [place] (primary) hypertension Paroxysmal atrial fibrillation Long term (current) use of anticoagulants Procedures Wound #1 DESTIN, KITTLER ([id number]) Pre-procedure diagnosis of Wound #1 is a Venous Leg Ulcer located on the Left,Distal,Dorsal Foot .Severity of Tissue Pre Debridement is: Fat layer exposed. There was a Selective/Open Wound Non-Viable Tissue Debridement with a total area of 2.4 sq cm performed by Tommie Sams., PA-C. With the following instrument(s): Curette to remove Non-Viable  tissue/material. Material removed includes Eschar after achieving pain control using Lidocaine 4% Topical Solution. No specimens were taken. A time out was conducted at 09:19, prior to the start of the procedure. There was no bleeding. The procedure was tolerated well with a pain level of 0 throughout and a pain level of 0 following the procedure. Post Debridement Measurements: 0.3cm length x 0.3cm width x 0.1cm depth; 0.007cm^3 volume. Character of Wound/Ulcer Post Debridement is improved. Severity of Tissue Post Debridement is: Fat layer exposed. Post procedure Diagnosis Wound #1: Same as Pre-Procedure Wound #2 Pre-procedure diagnosis of Wound #2 is a Venous Leg Ulcer located on the Left,Lateral,Dorsal Foot .Severity of Tissue Pre Debridement is: Fat layer exposed. There was a Selective/Open Wound Skin/Epidermis Debridement with a total area of 0.09 sq cm performed by Tommie Sams., PA-C. With the following instrument(s): Curette to remove Non-Viable tissue/material. Material removed includes Skin: Dermis and Skin: Epidermis and after achieving pain control using Lidocaine 4% Topical Solution. No specimens were taken. A time out was conducted at 09:19, prior to the start of the procedure. There was no bleeding. The procedure was tolerated well with a pain level of 0 throughout and a pain level of 0 following the procedure. Post Debridement Measurements: 1cm length x 1cm width x 0.1cm depth; 0.079cm^3 volume. Character of Wound/Ulcer Post Debridement is improved. Severity of Tissue Post Debridement is: Fat layer exposed. Post procedure Diagnosis Wound #2: Same as Pre-Procedure Plan Follow-up Appointments: Return Appointment in 1 week. Bathing/ Shower/ Hygiene: May shower with wound dressing protected with water repellent cover or cast protector. Edema Control - Lymphedema / Segmental Compressive Device / Other: Elevate, Exercise Daily and Avoid Standing for Long  Periods of Time. Elevate legs  to the level of the heart and pump ankles as often as possible Elevate leg(s) parallel to the floor when sitting. WOUND #1: - Foot Wound Laterality: Dorsal, Left, Distal Cleanser: Soap and Water (Generic) 1 x Per Week/30 Days Discharge Instructions: Gently cleanse wound with antibacterial soap, rinse and pat dry prior to dressing wounds Primary Dressing: Silvercel Small 2x2 (in/in) (Generic) 1 x Per Week/30 Days Discharge Instructions: Apply Silvercel Small 2x2 (in/in) as instructed Secondary Dressing: ABD Pad 5x9 (in/in) (Generic) 1 x Per Week/30 Days Discharge Instructions: Cover with ABD pad Compression Wrap: Profore LF 4 Multi-Layer Compression Bandaging System (Generic) 1 x Per Week/30 Days Discharge Instructions: Apply 4 multi-layer wrap as directed. Compression Stockings: Jobst Farrow Wrap 4000 (DME) Compression Amount: 30-40 mmHg (left) Discharge Instructions: Apply to lower extremity as directed. WOUND #2: - Foot Wound Laterality: Dorsal, Left, Lateral Cleanser: Soap and Water (Generic) 1 x Per Week/30 Days Discharge Instructions: Gently cleanse wound with antibacterial soap, rinse and pat dry prior to dressing wounds Primary Dressing: Silvercel Small 2x2 (in/in) (Generic) 1 x Per Week/30 Days Discharge Instructions: Apply Silvercel Small 2x2 (in/in) as instructed Secondary Dressing: ABD Pad 5x9 (in/in) (Generic) 1 x Per Week/30 Days Discharge Instructions: Cover with ABD pad Compression Wrap: Profore LF 4 Multi-Layer Compression Bandaging System (Generic) 1 x Per Week/30 Days Discharge Instructions: Apply 4 multi-layer wrap as directed. 1. Would recommend currently that we go ahead and initiate treatment with a silver alginate dressing over the wounds I think this is again to do well for her. 2. I am also can recommend a 3 layer compression wrap to help with edema control. 3. I would also suggest she is continue to elevate her legs much as possible help with edema control. 4.  In regard to more long-term sustained control of her edema and wounds have good I suggest that we go ahead and order her a Velcro compression wrap which I think is good to be much more effective for her and more easily donned than a standard compression sock would. We will see patient back for reevaluation in 1 week here in the clinic. If anything worsens or changes patient will contact our office for additional recommendations. Electronic Signature(s) Signed: 12/30/2020 9:40:55 AM By: Worthy Keeler PA-C Previous Signature: 12/30/2020 9:35:25 AM Version By: Worthy Keeler PA-C Entered By: Worthy Keeler on 12/30/2020 09:40:55 [name], [name] ([id number]) [name], [name] ([id number]) -------------------------------------------------------------------------------- ROS/PFSH Details Patient Name: [name] Date of Service: 12/30/2020 8:30 AM Medical Record Number: [id number] Patient Account Number: [id number] Date of Birth/Sex: Sep 04, 1935 (86 y.o. F) Treating RN: Donnamarie Poag Primary Care Provider: [name] Other Clinician: Jeanine Luz Referring Provider: Roland Rack Treating Provider/Extender: Skipper Cliche in Treatment: 0 Information Obtained From Patient Constitutional Symptoms (General Health) Complaints and Symptoms: Negative for: Fatigue; Fever; Chills; Marked Weight Change Eyes Complaints and Symptoms: Positive for: Glasses / Contacts Ear/Nose/Mouth/Throat Complaints and Symptoms: Negative for: Difficult clearing ears; Sinusitis Hematologic/Lymphatic Complaints and Symptoms: Negative for: Bleeding / Clotting Disorders; Human Immunodeficiency Virus Respiratory Complaints and Symptoms: Negative for: Chronic or frequent coughs; Shortness of Breath Gastrointestinal Complaints and Symptoms: Negative for: Frequent diarrhea; Nausea; Vomiting Endocrine Complaints and Symptoms: Negative for: Hepatitis; Thyroid disease; Polydypsia (Excessive  Thirst) Genitourinary Complaints and Symptoms: Negative for: Kidney failure/ Dialysis; Incontinence/dribbling Immunological Complaints and Symptoms: Negative for: Hives; Itching Integumentary (Skin) Complaints and Symptoms: Positive for: Bleeding or bruising tendency - xarelto Neurologic Complaints and Symptoms: Negative for: Numbness/parasthesias;  Focal/Weakness [name], [name] ([id number]) Psychiatric Complaints and Symptoms: Positive for: Anxiety - in the past Negative for: Claustrophobia Cardiovascular Medical History: Positive for: Hypertension - Afib Musculoskeletal Medical History: Positive for: Osteoarthritis Oncologic Immunizations Pneumococcal Vaccine: Received Pneumococcal Vaccination: Yes Implantable Devices None Hospitalization / Surgery History Type of Hospitalization/Surgery hysterectomy choclecystectomy tonsillectomy Family and Social History Cancer: Yes - Child; Diabetes: No; Heart Disease: Yes - Mother,Maternal Grandparents; Hereditary Spherocytosis: No; Hypertension: Yes - Mother,Paternal Grandparents,Maternal Grandparents; Kidney Disease: Yes - Siblings; Lung Disease: No; Seizures: No; Stroke: Yes - Mother; Thyroid Problems: No; Tuberculosis: No; Former smoker; Marital Status - Married; Alcohol Use: Never; Drug Use: No History; Caffeine Use: Never; Financial Concerns: No; Food, Clothing or Shelter Needs: No; Support System Lacking: No; Transportation Concerns: No Electronic Signature(s) Signed: 12/30/2020 4:31:50 PM By: Worthy Keeler PA-C Signed: 12/30/2020 4:36:12 PM By: Donnamarie Poag Entered By: Donnamarie Poag on 12/30/2020 08:42:11 [name] ([id number]) -------------------------------------------------------------------------------- SuperBill Details Patient Name: [name]. Date of Service: 12/30/2020 Medical Record Number: [id number] Patient Account Number: [id number] Date of Birth/Sex: February 23, 1935 (86 y.o. F) Treating RN: Carlene Coria Primary Care Provider: [name] Other Clinician: Jeanine Luz Referring Provider: Roland Rack Treating Provider/Extender: Skipper Cliche in Treatment: 0 Diagnosis Coding ICD-10 Codes Code Description I87.2 Venous insufficiency (chronic) (peripheral) L97.522 Non-pressure chronic ulcer of other part of left foot with fat layer exposed I10 Essential (primary) hypertension I48.0 Paroxysmal atrial fibrillation Z79.01 Long term (current) use of anticoagulants Facility Procedures CPT4 Code: 56387564 Description: 33295 - WOUND CARE VISIT-LEV 3 EST PT Modifier: 25 Quantity: 1 CPT4 Code: 18841660 Description: 63016 - DEBRIDE WOUND 1ST 20 SQ CM OR < Modifier: Quantity: 1 CPT4 Code: Description: ICD-10 Diagnosis Description L97.522 Non-pressure chronic ulcer of other part of left foot with fat layer expo Modifier: sed Quantity: Physician Procedures CPT4 Code: 0109323 Description: WC PHYS LEVEL 3 o NEW PT Modifier: 25 Quantity: 1 CPT4 Code: Description: ICD-10 Diagnosis Description I87.2 Venous insufficiency (chronic) (peripheral) L97.522 Non-pressure chronic ulcer of other part of left foot with fat layer expo I10 Essential (primary) hypertension I48.0 Paroxysmal atrial fibrillation Modifier: sed Quantity: CPT4 Code: 5573220 Description: 97597 - WC PHYS DEBR WO ANESTH 20 SQ CM Modifier: Quantity: 1 CPT4 Code: Description: ICD-10 Diagnosis Description L97.522 Non-pressure chronic ulcer of other part of left foot with fat layer expo Modifier: sed Quantity: Electronic Signature(s) Signed: 12/30/2020 4:31:50 PM By: Worthy Keeler PA-C Signed: 12/30/2020 5:13:28 PM By: Carlene Coria RN Previous Signature: 12/30/2020 9:35:59 AM Version By: Worthy Keeler PA-C Entered By: Carlene Coria on 12/30/2020 09:37:39

## 2020-12-31 DIAGNOSIS — I872 Venous insufficiency (chronic) (peripheral): Secondary | ICD-10-CM | POA: Diagnosis not present

## 2021-01-03 ENCOUNTER — Other Ambulatory Visit: Payer: Self-pay

## 2021-01-03 DIAGNOSIS — L97522 Non-pressure chronic ulcer of other part of left foot with fat layer exposed: Secondary | ICD-10-CM | POA: Diagnosis not present

## 2021-01-03 NOTE — Progress Notes (Addendum)
VAUNDA, GUTTERMAN ([id number]) Visit Report for 01/03/2021 Chief Complaint Document Details Patient Name: [name], [name]. Date of Service: 01/03/2021 12:30 PM Medical Record Number: [id number] Patient Account Number: [id number] Date of Birth/Sex: November 25, 1934 (85 y.o. F) Treating RN: Carlene Coria Primary Care Provider: [name] Other Clinician: Referring Provider: [name] Treating Provider/Extender: Skipper Cliche in Treatment: 0 Information Obtained from: Patient Chief Complaint Left foot ulcers Electronic Signature(s) Signed: 01/03/2021 1:58:29 PM By: Worthy Keeler PA-C Entered By: Worthy Keeler on 01/03/2021 13:58:29 [place], [place] ([id number]) -------------------------------------------------------------------------------- HPI Details Patient Name: [name] Date of Service: 01/03/2021 12:30 PM Medical Record Number: [id number] Patient Account Number: [id number] Date of Birth/Sex: 04/18/1935 (85 y.o. F) Treating RN: Carlene Coria Primary Care Provider: [name] Other Clinician: Referring Provider: [name] Treating Provider/Extender: Skipper Cliche in Treatment: 0 History of Present Illness HPI Description: 12/30/2020 upon evaluation today patient presents for initial inspection here in the clinic concerning issues she has been having with wounds over the left foot since she sustained a fall in February 2022. Subsequently she does have what appears to be mild chronic venous stasis just looking at her legs bilaterally with that being said following the fall she did have a fractured bone in her foot as well as spraining her ankle which has caused increased swelling and I think this is led to venous ulcerations. Fortunately there does not appear to be any signs of active infection at this time. No fevers, chills, nausea, vomiting, or diarrhea. The patient is currently on Xarelto as a blood thinner due to chronic atrial fibrillation. She also does  have hypertension. Fortunately she has no other major medical problems she does take care of her 6 year old husband who requires a lot of help she tells me therefore she is on her feet a lot. She has been trying to elevate her legs however which has also helped some with the swelling. She is not able to get compression socks on. 01/03/2021 upon evaluation today patient presents for reevaluation early here in the clinic due to issues she has been having with the compression wrap. She tells me that she is having a lot of discomfort. The good news is she does come with the Velcro compression wrap as well and subsequently that may help [place] out here to. In the end that it actually appeared to be that she was completely healed today which was great news after just a few days and the compression wrap that made a huge difference she has been dealing with this for quite some time. Nonetheless I recommended that she should wear the Velcro compression wrap and we did take the time to show her how to put that on today so that she would be able to use that going forward. Both foot ulcers however appear to be doing better though she was much more swollen where she had cut the wrap back on her toes as well as at the top she was swelling above these areas which is to be expected. Electronic Signature(s) Signed: 01/03/2021 1:58:46 PM By: Worthy Keeler PA-C Entered By: Worthy Keeler on 01/03/2021 13:58:46 [place], [place] ([id number]) -------------------------------------------------------------------------------- Physical Exam Details Patient Name: [name], [name]. Date of Service: 01/03/2021 12:30 PM Medical Record Number: [id number] Patient Account Number: [id number] Date of Birth/Sex: 08-Sep-1935 (85 y.o. F) Treating RN: Carlene Coria Primary Care Provider: [name] Other Clinician: Referring Provider: [name] Treating Provider/Extender: Skipper Cliche in Treatment:  0 Constitutional Well-nourished and  well-hydrated in no acute distress. Respiratory normal breathing without difficulty. Psychiatric this patient is able to make decisions and demonstrates good insight into disease process. Alert and Oriented x 3. pleasant and cooperative. Notes Patient's wound bed actually showed signs of complete epithelization there does not appear to be thing open or draining at this point which is great news. For that reason I did go ahead and recommend that we can going to discontinue wound care services at this point. We did show her how to put on her [place] Char wrap that we ordered. Electronic Signature(s) Signed: 01/03/2021 1:59:04 PM By: Worthy Keeler PA-C Entered By: Worthy Keeler on 01/03/2021 13:59:04 [place], [place] ([id number]) -------------------------------------------------------------------------------- Physician Orders Details Patient Name: [name], [name]. Date of Service: 01/03/2021 12:30 PM Medical Record Number: [id number] Patient Account Number: [id number] Date of Birth/Sex: [birth year] (85 y.o. F) Treating RN: Carlene Coria Primary Care Provider: [name] Other Clinician: Referring Provider: [name] Treating Provider/Extender: Skipper Cliche in Treatment: 0 Verbal / Phone Orders: No Diagnosis Coding ICD-10 Coding Code Description I87.2 Venous insufficiency (chronic) (peripheral) L97.522 Non-pressure chronic ulcer of other part of left foot with fat layer exposed I10 Essential (primary) hypertension I48.0 Paroxysmal atrial fibrillation Z79.01 Long term (current) use of anticoagulants Discharge From [facility] Services o Discharge from [place] Treatment Complete - patient to apply compression wraps in the am remove in the pm Electronic Signature(s) Signed: 01/03/2021 5:20:58 PM By: Worthy Keeler PA-C Signed: 01/06/2021 7:55:11 AM By: Carlene Coria RN Previous Signature: 01/03/2021 2:01:01 PM Version By: Worthy Keeler  PA-C Entered By: Carlene Coria on 01/03/2021 14:01:31 [name] ([id number]) -------------------------------------------------------------------------------- Problem List Details Patient Name: [name], [name]. Date of Service: 01/03/2021 12:30 PM Medical Record Number: [id number] Patient Account Number: [id number] Date of Birth/Sex: 1934-09-22 (85 y.o. F) Treating RN: Carlene Coria Primary Care Provider: [name] Other Clinician: Referring Provider: [name] Treating Provider/Extender: Skipper Cliche in Treatment: 0 Active Problems ICD-10 Encounter Code Description Active Date MDM Diagnosis I87.2 Venous insufficiency (chronic) (peripheral) 12/30/2020 No Yes L97.522 Non-pressure chronic ulcer of other part of left foot with fat layer 12/30/2020 No Yes exposed [place] (primary) hypertension 12/30/2020 No Yes I48.0 Paroxysmal atrial fibrillation 12/30/2020 No Yes Z79.01 Long term (current) use of anticoagulants 12/30/2020 No Yes Inactive Problems Resolved Problems Electronic Signature(s) Signed: 01/03/2021 1:57:38 PM By: Worthy Keeler PA-C Entered By: Worthy Keeler on 01/03/2021 13:57:37 [place], [place] ([id number]) -------------------------------------------------------------------------------- Progress Note Details Patient Name: [name] Date of Service: 01/03/2021 12:30 PM Medical Record Number: [id number] Patient Account Number: [id number] Date of Birth/Sex: 05-Jan-1935 (85 y.o. F) Treating RN: Carlene Coria Primary Care Provider: [name] Other Clinician: Referring Provider: [name] Treating Provider/Extender: Skipper Cliche in Treatment: 0 Subjective Chief Complaint Information obtained from Patient Left foot ulcers History of Present Illness (HPI) 12/30/2020 upon evaluation today patient presents for initial inspection here in the clinic concerning issues she has been having with wounds over the left foot since she  sustained a fall in February 2022. Subsequently she does have what appears to be mild chronic venous stasis just looking at her legs bilaterally with that being said following the fall she did have a fractured bone in her foot as well as spraining her ankle which has caused increased swelling and I think this is led to venous ulcerations. Fortunately there does not appear to be any signs of active infection at this time. No fevers, chills, nausea, vomiting, or diarrhea.  The patient is currently on Xarelto as a blood thinner due to chronic atrial fibrillation. She also does have hypertension. Fortunately she has no other major medical problems she does take care of her 58 year old husband who requires a lot of help she tells me therefore she is on her feet a lot. She has been trying to elevate her legs however which has also helped some with the swelling. She is not able to get compression socks on. 01/03/2021 upon evaluation today patient presents for reevaluation early here in the clinic due to issues she has been having with the compression wrap. She tells me that she is having a lot of discomfort. The good news is she does come with the Velcro compression wrap as well and subsequently that may help [place] out here to. In the end that it actually appeared to be that she was completely healed today which was great news after just a few days and the compression wrap that made a huge difference she has been dealing with this for quite some time. Nonetheless I recommended that she should wear the Velcro compression wrap and we did take the time to show her how to put that on today so that she would be able to use that going forward. Both foot ulcers however appear to be doing better though she was much more swollen where she had cut the wrap back on her toes as well as at the top she was swelling above these areas which is to be expected. Objective Constitutional Well-nourished and well-hydrated in no acute  distress. Vitals Time Taken: 12:40 PM, Height: 68 in, Weight: 163 lbs, BMI: 24.8. Respiratory normal breathing without difficulty. Psychiatric this patient is able to make decisions and demonstrates good insight into disease process. Alert and Oriented x 3. pleasant and cooperative. General Notes: Patient's wound bed actually showed signs of complete epithelization there does not appear to be thing open or draining at this point which is great news. For that reason I did go ahead and recommend that we can going to discontinue wound care services at this point. We did show her how to put on her Wallie Char wrap that we ordered. Integumentary (Hair, Skin) Wound #1 status is Open. Original cause of wound was Gradually Appeared. The date acquired was: 11/09/2020. The wound is located on the Left,Distal,Dorsal Foot. The wound measures 0cm length x 0cm width x 0cm depth; 0cm^2 area and 0cm^3 volume. There is no tunneling or undermining noted. There is a none present amount of drainage noted. There is no granulation within the wound bed. There is no necrotic tissue within the wound bed. Wound #2 status is Open. Original cause of wound was Gradually Appeared. The date acquired was: 11/09/2020. The wound is located on the Left,Lateral,Dorsal Foot. The wound measures 0cm length x 0cm width x 0cm depth; 0cm^2 area and 0cm^3 volume. There is no tunneling or undermining noted. There is a none present amount of drainage noted. There is no granulation within the wound bed. There is no necrotic tissue within the wound bed. [name], [name] ([id number]) Assessment Active Problems ICD-10 Venous insufficiency (chronic) (peripheral) Non-pressure chronic ulcer of other part of left foot with fat layer exposed Essential (primary) hypertension Paroxysmal atrial fibrillation Long term (current) use of anticoagulants Plan Discharge From [facility] Services: Discharge from [place] Treatment Complete - Patient is to  wear the [place] wrap 4000 which she was instructed on how to use today and basically she should wear this from the  time she wakes up in the morning through the day taking it off at bedtime when she can take a shower and put lotion on as well. She voiced understanding and was shown specifically how to apply and remove this. 1. I would recommend currently that we go ahead and just transition her to the [facility] compression wrap which showed her how to put that on today the patient was appreciative of that. 2. I am also can recommend at this time that we have the patient going continue to utilize a Velcro compression wrap we ordered and showed her how to use today she is in agreement with that plan. We will see her back for follow-up visit as needed. Electronic Signature(s) Signed: 01/03/2021 2:01:14 PM By: Worthy Keeler PA-C Entered By: Worthy Keeler on 01/03/2021 14:01:14 [place], [place] ([id number]) -------------------------------------------------------------------------------- SuperBill Details Patient Name: [name] Date of Service: 01/03/2021 Medical Record Number: [id number] Patient Account Number: [id number] Date of Birth/Sex: Mar 11, 1935 (86 y.o. F) Treating RN: Carlene Coria Primary Care Provider: [name] Other Clinician: Referring Provider: [name] Treating Provider/Extender: Skipper Cliche in Treatment: 0 Diagnosis Coding ICD-10 Codes Code Description I87.2 Venous insufficiency (chronic) (peripheral) L97.522 Non-pressure chronic ulcer of other part of left foot with fat layer exposed [place] (primary) hypertension I48.0 Paroxysmal atrial fibrillation Z79.01 Long term (current) use of anticoagulants Facility Procedures CPT4 Code: 84696295 Description: [phone] - WOUND CARE VISIT-LEV 2 EST PT Modifier: Quantity: 1 Physician Procedures CPT4 Code: 2440102 Description: 72536 - WC PHYS LEVEL 3 - EST PT Modifier: Quantity: 1 CPT4  Code: Description: ICD-10 Diagnosis Description I87.2 Venous insufficiency (chronic) (peripheral) L97.522 Non-pressure chronic ulcer of other part of left foot with fat layer ex I10 Essential (primary) hypertension I48.0 Paroxysmal atrial fibrillation Modifier: posed Quantity: Electronic Signature(s) Signed: 01/03/2021 5:20:58 PM By: Worthy Keeler PA-C Signed: 01/06/2021 7:55:11 AM By: Carlene Coria RN Previous Signature: 01/03/2021 2:01:25 PM Version By: Worthy Keeler PA-C Entered By: Carlene Coria on 01/03/2021 14:02:31

## 2021-01-06 ENCOUNTER — Ambulatory Visit: Payer: PPO | Admitting: Physician Assistant

## 2021-01-06 NOTE — Progress Notes (Signed)
JEANANN, BALINSKI ([id number]) Visit Report for 01/03/2021 Arrival Information Details Patient Name: [name], [name]. Date of Service: 01/03/2021 12:30 PM Medical Record Number: [id number] Patient Account Number: [id number] Date of Birth/Sex: Aug 08, 1935 (85 y.o. F) Treating RN: Carlene Coria Primary Care Harce Volden: [name] Other Clinician: Referring Shenique Childers: [name] Treating Bane Hagy/Extender: Skipper Cliche in Treatment: 0 Visit Information History Since Last Visit All ordered tests and consults were completed: No Patient Arrived: Ambulatory Added or deleted any medications: No Arrival Time: 13:59 Any new allergies or adverse reactions: No Accompanied By: self Had a fall or experienced change in No Transfer Assistance: None activities of daily living that may affect Patient Identification Verified: Yes risk of falls: Secondary Verification Process Completed: Yes Signs or symptoms of abuse/neglect since last visito No Patient Requires Transmission-Based No Hospitalized since last visit: No Precautions: Implantable device outside of the clinic excluding No Patient Has Alerts: Yes cellular tissue based products placed in the center Patient Alerts: Patient on Blood since last visit: Thinner Has Dressing in Place as Prescribed: Yes TAKES XARELTO Has Compression in Place as Prescribed: No NOT diabetic Pain Present Now: No Electronic Signature(s) Signed: 01/06/2021 7:55:11 AM By: Carlene Coria RN Entered By: Carlene Coria on 01/03/2021 13:59:50 [place], [place] ([id number]) -------------------------------------------------------------------------------- Clinic Level of Care Assessment Details Patient Name: [name]. Date of Service: 01/03/2021 12:30 PM Medical Record Number: [id number] Patient Account Number: [id number] Date of Birth/Sex: [birth year] (85 y.o. F) Treating RN: Carlene Coria Primary Care Janira Mandell: [name] Other Clinician: Referring  Albertus Chiarelli: [name] Treating Bharath Bernstein/Extender: Skipper Cliche in Treatment: 0 Clinic Level of Care Assessment Items TOOL 4 Quantity Score X - Use when only an EandM is performed on FOLLOW-UP visit 1 0 ASSESSMENTS - Nursing Assessment / Reassessment X - Reassessment of Co-morbidities (includes updates in patient status) 1 10 X- 1 5 Reassessment of Adherence to Treatment Plan ASSESSMENTS - Wound and Skin Assessment / Reassessment []  - Simple Wound Assessment / Reassessment - one wound 0 X- 2 5 Complex Wound Assessment / Reassessment - multiple wounds []  - 0 Dermatologic / Skin Assessment (not related to wound area) ASSESSMENTS - Focused Assessment []  - Circumferential Edema Measurements - multi extremities 0 []  - 0 Nutritional Assessment / Counseling / Intervention []  - 0 Lower Extremity Assessment (monofilament, tuning fork, pulses) []  - 0 Peripheral Arterial Disease Assessment (using hand held doppler) ASSESSMENTS - Ostomy and/or Continence Assessment and Care []  - Incontinence Assessment and Management 0 []  - 0 Ostomy Care Assessment and Management (repouching, etc.) PROCESS - Coordination of Care X - Simple Patient / Family Education for ongoing care 1 15 []  - 0 Complex (extensive) Patient / Family Education for ongoing care []  - 0 Staff obtains Programmer, systems, Records, Test Results / Process Orders []  - 0 Staff telephones HHA, Nursing Homes / Clarify orders / etc []  - 0 Routine Transfer to another Facility (non-emergent condition) []  - 0 Routine Hospital Admission (non-emergent condition) []  - 0 New Admissions / Biomedical engineer / Ordering NPWT, Apligraf, etc. []  - 0 Emergency Hospital Admission (emergent condition) X- 1 10 Simple Discharge Coordination []  - 0 Complex (extensive) Discharge Coordination PROCESS - Special Needs []  - Pediatric / Minor Patient Management 0 []  - 0 Isolation Patient Management []  - 0 Hearing / Language / Visual special  needs []  - 0 Assessment of Community assistance (transportation, D/C planning, etc.) []  - 0 Additional assistance / Altered mentation []  - 0 Support Surface(s) Assessment (bed, cushion, seat, etc.) INTERVENTIONS -  Wound Cleansing / Measurement MARRANDA, ARAKELIAN ([id number]) []  - 0 Simple Wound Cleansing - one wound X- 2 5 Complex Wound Cleansing - multiple wounds []  - 0 Wound Imaging (photographs - any number of wounds) []  - 0 Wound Tracing (instead of photographs) []  - 0 Simple Wound Measurement - one wound X- 2 5 Complex Wound Measurement - multiple wounds INTERVENTIONS - Wound Dressings []  - Small Wound Dressing one or multiple wounds 0 []  - 0 Medium Wound Dressing one or multiple wounds []  - 0 Large Wound Dressing one or multiple wounds []  - 0 Application of Medications - topical []  - 0 Application of Medications - injection INTERVENTIONS - Miscellaneous []  - External ear exam 0 []  - 0 Specimen Collection (cultures, biopsies, blood, body fluids, etc.) []  - 0 Specimen(s) / Culture(s) sent or taken to Lab for analysis []  - 0 Patient Transfer (multiple staff / Civil Service fast streamer / Similar devices) []  - 0 Simple Staple / Suture removal (25 or less) []  - 0 Complex Staple / Suture removal (26 or more) []  - 0 Hypo / Hyperglycemic Management (close monitor of Blood Glucose) []  - 0 Ankle / Brachial Index (ABI) - do not check if billed separately []  - 0 Vital Signs Has the patient been seen at the hospital within the last three years: Yes Total Score: 70 Level Of Care: New/Established - Level 2 Electronic Signature(s) Signed: 01/06/2021 7:55:11 AM By: Carlene Coria RN Entered By: Carlene Coria on 01/03/2021 14:02:10 [name] ([id number]) -------------------------------------------------------------------------------- Encounter Discharge Information Details Patient Name: [name]. Date of Service: 01/03/2021 12:30 PM Medical Record Number:  [id number] Patient Account Number: [id number] Date of Birth/Sex: 23-Dec-1934 (85 y.o. F) Treating RN: Carlene Coria Primary Care Waylon Koffler: [name] Other Clinician: Referring Jasma Seevers: [name] Treating Gittel Mccamish/Extender: Skipper Cliche in Treatment: 0 Encounter Discharge Information Items Discharge Condition: Stable Ambulatory Status: Ambulatory Discharge Destination: Home Transportation: Private Auto Accompanied By: self Schedule Follow-up Appointment: Yes Clinical Summary of Care: Patient Declined Electronic Signature(s) Signed: 01/06/2021 7:55:11 AM By: Carlene Coria RN Entered By: Carlene Coria on 01/03/2021 14:03:45 [name] ([id number]) -------------------------------------------------------------------------------- Lower Extremity Assessment Details Patient Name: [name]. Date of Service: 01/03/2021 12:30 PM Medical Record Number: [id number] Patient Account Number: [id number] Date of Birth/Sex: 28-Sep-1934 (85 y.o. F) Treating RN: Carlene Coria Primary Care Ravis Herne: [name] Other Clinician: Referring Junah Yam: [name] Treating Jovonte Commins/Extender: Skipper Cliche in Treatment: 0 Electronic Signature(s) Signed: 01/06/2021 7:55:11 AM By: Carlene Coria RN Entered By: Carlene Coria on 01/03/2021 14:00:15 [name] ([id number]) -------------------------------------------------------------------------------- Multi Wound Chart Details Patient Name: [name], [name]. Date of Service: 01/03/2021 12:30 PM Medical Record Number: [id number] Patient Account Number: [id number] Date of Birth/Sex: 11-Jan-1935 (85 y.o. F) Treating RN: Carlene Coria Primary Care Johna Kearl: [name] Other Clinician: Referring Treyvon Blahut: [name] Treating Cheyene Hamric/Extender: Skipper Cliche in Treatment: 0 Photos: [1:No Photos] [2:No Photos] [N/A:N/A] Wound Location: [1:Left, Distal, Dorsal Foot] [2:Left, Lateral, Dorsal Foot] [N/A:N/A] Wounding Event:  [1:Gradually Appeared] [2:Gradually Appeared] [N/A:N/A] Primary Etiology: [1:Venous Leg Ulcer] [2:Venous Leg Ulcer] [N/A:N/A] Comorbid History: [1:Hypertension, Osteoarthritis] [2:Hypertension, Osteoarthritis] [N/A:N/A] Date Acquired: [1:11/09/2020] [2:11/09/2020] [N/A:N/A] Weeks of Treatment: [1:0] [2:0] [N/A:N/A] Wound Status: [1:Open] [2:Open] [N/A:N/A] Measurements L x W x D (cm) [1:0x0x0] [2:0x0x0] [N/A:N/A] Area (cm) : [1:0] [2:0] [N/A:N/A] Volume (cm) : [1:0] [2:0] [N/A:N/A] % Reduction in Area: [1:100.00%] [2:100.00%] [N/A:N/A] % Reduction in Volume: [1:100.00%] [2:100.00%] [N/A:N/A] Classification: [1:Full Thickness Without Exposed Support Structures] [2:Full Thickness Without Exposed Support Structures] [N/A:N/A] Exudate  Amount: [1:None Present] [2:None Present] [N/A:N/A] Granulation Amount: [1:None Present (0%)] [2:None Present (0%)] [N/A:N/A] Necrotic Amount: [1:None Present (0%)] [2:None Present (0%)] [N/A:N/A] Exposed Structures: [1:Fascia: No Fat Layer (Subcutaneous Tissue): No Tendon: No Muscle: No Joint: No Bone: No None] [2:Fascia: No Fat Layer (Subcutaneous Tissue): No Tendon: No Muscle: No Joint: No Bone: No None] [N/A:N/A N/A] Treatment Notes Electronic Signature(s) Signed: 01/06/2021 7:55:11 AM By: Carlene Coria RN Entered By: Carlene Coria on 01/03/2021 14:00:49 [name] ([id number]) -------------------------------------------------------------------------------- [place] Details Patient Name: [name]. Date of Service: 01/03/2021 12:30 PM Medical Record Number: [id number] Patient Account Number: [id number] Date of Birth/Sex: [birth year] (85 y.o. F) Treating RN: Carlene Coria Primary Care Jezebel Pollet: [name] Other Clinician: Referring Nikkia Devoss: [name] Treating Ezella Kell/Extender: Skipper Cliche in Treatment: 0 Active Inactive Electronic Signature(s) Signed: 01/06/2021 7:55:11 AM By: Carlene Coria RN Entered By: Carlene Coria on 01/03/2021 14:00:30 [name] ([id number]) -------------------------------------------------------------------------------- Pain Assessment Details Patient Name: [name] Date of Service: 01/03/2021 12:30 PM Medical Record Number: [id number] Patient Account Number: [id number] Date of Birth/Sex: 1935-04-10 (85 y.o. F) Treating RN: Carlene Coria Primary Care Juquan Reznick: [name] Other Clinician: Referring Delores Thelen: [name] Treating Audon Heymann/Extender: Skipper Cliche in Treatment: 0 Active Problems Location of Pain Severity and Description of Pain Patient Has Paino No Site Locations Pain Management and Medication Current Pain Management: Electronic Signature(s) Signed: 01/06/2021 7:55:11 AM By: Carlene Coria RN Entered By: Carlene Coria on 01/03/2021 14:00:02 [name] ([id number]) -------------------------------------------------------------------------------- Patient/Caregiver Education Details Patient Name: [name] Date of Service: 01/03/2021 12:30 PM Medical Record Number: [id number] Patient Account Number: [id number] Date of Birth/Gender: 06/15/1935 (86 y.o. F) Treating RN: Carlene Coria Primary Care Physician: [name] Other Clinician: Referring Physician: [name] Treating Physician/Extender: Skipper Cliche in Treatment: 0 Education Assessment Education Provided To: Patient Education Topics Provided Wound/Skin Impairment: Methods: Explain/Verbal Responses: State content correctly Electronic Signature(s) Signed: 01/06/2021 7:55:11 AM By: Carlene Coria RN Entered By: Carlene Coria on 01/03/2021 14:03:03 [name] ([id number]) -------------------------------------------------------------------------------- Wound Assessment Details Patient Name: [name] Date of Service: 01/03/2021 12:30 PM Medical Record Number: [id number] Patient Account Number: [id number] Date of Birth/Sex: 1934-10-20 (85 y.o.  F) Treating RN: Carlene Coria Primary Care Psalm Arman: [name] Other Clinician: Referring Lakiyah Arntson: [name] Treating Malaisha Silliman/Extender: Skipper Cliche in Treatment: 0 Wound Status Wound Number: 1 Primary Etiology: Venous Leg Ulcer Wound Location: Left, Distal, Dorsal Foot Wound Status: Open Wounding Event: Gradually Appeared Comorbid History: Hypertension, Osteoarthritis Date Acquired: 11/09/2020 Weeks Of Treatment: 0 Clustered Wound: No Wound Measurements Length: (cm) 0 Width: (cm) 0 Depth: (cm) 0 Area: (cm) 0 Volume: (cm) 0 % Reduction in Area: 100% % Reduction in Volume: 100% Epithelialization: None Tunneling: No Undermining: No Wound Description Classification: Full Thickness Without Exposed Support Structure Exudate Amount: None Present s Foul Odor After Cleansing: No Slough/Fibrino No Wound Bed Granulation Amount: None Present (0%) Exposed Structure Necrotic Amount: None Present (0%) Fascia Exposed: No Fat Layer (Subcutaneous Tissue) Exposed: No Tendon Exposed: No Muscle Exposed: No Joint Exposed: No Bone Exposed: No Electronic Signature(s) Signed: 01/06/2021 7:55:11 AM By: Carlene Coria RN Entered By: Carlene Coria on 01/03/2021 13:58:48 [name] ([id number]) -------------------------------------------------------------------------------- Wound Assessment Details Patient Name: [name]. Date of Service: 01/03/2021 12:30 PM Medical Record Number: [id number] Patient Account Number: [id number] Date of Birth/Sex: September 06, 1935 (85 y.o. F) Treating RN: Carlene Coria Primary Care Brooklynne Pereida: [name] Other Clinician: Referring Mignon Bechler: [name] Treating Jakobie Henslee/Extender: Skipper Cliche  in Treatment: 0 Wound Status Wound Number: 2 Primary Etiology: Venous Leg Ulcer Wound Location: Left, Lateral, Dorsal Foot Wound Status: Open Wounding Event: Gradually Appeared Comorbid History: Hypertension, Osteoarthritis Date Acquired:  11/09/2020 Weeks Of Treatment: 0 Clustered Wound: No Wound Measurements Length: (cm) 0 Width: (cm) 0 Depth: (cm) 0 Area: (cm) 0 Volume: (cm) 0 % Reduction in Area: 100% % Reduction in Volume: 100% Epithelialization: None Tunneling: No Undermining: No Wound Description Classification: Full Thickness Without Exposed Support Structure Exudate Amount: None Present s Foul Odor After Cleansing: No Slough/Fibrino No Wound Bed Granulation Amount: None Present (0%) Exposed Structure Necrotic Amount: None Present (0%) Fascia Exposed: No Fat Layer (Subcutaneous Tissue) Exposed: No Tendon Exposed: No Muscle Exposed: No Joint Exposed: No Bone Exposed: No Electronic Signature(s) Signed: 01/06/2021 7:55:11 AM By: Carlene Coria RN Entered By: Carlene Coria on 01/03/2021 13:59:18 [place], [place] ([id number]) -------------------------------------------------------------------------------- Vitals Details Patient Name: [name]. Date of Service: 01/03/2021 12:30 PM Medical Record Number: [id number] Patient Account Number: [id number] Date of Birth/Sex: Feb 09, 1935 (85 y.o. F) Treating RN: Carlene Coria Primary Care Dyneshia Baccam: [name] Other Clinician: Referring Vernita Tague: [name] Treating Malijah Lietz/Extender: Skipper Cliche in Treatment: 0 Vital Signs Time Taken: 12:40 Reference Range: 80 - 120 mg / dl Height (in): 68 Weight (lbs): 163 Body Mass Index (BMI): 24.8 Electronic Signature(s) Signed: 01/06/2021 7:55:11 AM By: Carlene Coria RN Entered By: Carlene Coria on 01/03/2021 13:59:58

## 2021-01-11 ENCOUNTER — Other Ambulatory Visit: Payer: Self-pay | Admitting: Cardiology

## 2021-01-16 DIAGNOSIS — Z0000 Encounter for general adult medical examination without abnormal findings: Secondary | ICD-10-CM | POA: Diagnosis not present

## 2021-01-16 DIAGNOSIS — E559 Vitamin D deficiency, unspecified: Secondary | ICD-10-CM | POA: Diagnosis not present

## 2021-01-16 DIAGNOSIS — E785 Hyperlipidemia, unspecified: Secondary | ICD-10-CM | POA: Diagnosis not present

## 2021-01-23 DIAGNOSIS — I10 Essential (primary) hypertension: Secondary | ICD-10-CM | POA: Diagnosis not present

## 2021-01-23 DIAGNOSIS — E785 Hyperlipidemia, unspecified: Secondary | ICD-10-CM | POA: Diagnosis not present

## 2021-01-23 DIAGNOSIS — Z7901 Long term (current) use of anticoagulants: Secondary | ICD-10-CM | POA: Diagnosis not present

## 2021-01-23 DIAGNOSIS — E038 Other specified hypothyroidism: Secondary | ICD-10-CM | POA: Diagnosis not present

## 2021-01-23 DIAGNOSIS — Z8639 Personal history of other endocrine, nutritional and metabolic disease: Secondary | ICD-10-CM | POA: Diagnosis not present

## 2021-01-23 DIAGNOSIS — I4891 Unspecified atrial fibrillation: Secondary | ICD-10-CM | POA: Diagnosis not present

## 2021-01-23 DIAGNOSIS — S92902G Unspecified fracture of left foot, subsequent encounter for fracture with delayed healing: Secondary | ICD-10-CM | POA: Diagnosis not present

## 2021-01-23 DIAGNOSIS — E559 Vitamin D deficiency, unspecified: Secondary | ICD-10-CM | POA: Diagnosis not present

## 2021-02-12 ENCOUNTER — Other Ambulatory Visit: Payer: Self-pay

## 2021-02-12 ENCOUNTER — Ambulatory Visit: Payer: PPO | Admitting: Cardiology

## 2021-02-12 ENCOUNTER — Encounter: Payer: Self-pay | Admitting: Cardiology

## 2021-02-12 VITALS — BP 182/94 | HR 70 | Temp 98.0°F | Resp 16 | Ht 67.0 in | Wt 165.0 lb

## 2021-02-12 DIAGNOSIS — I4819 Other persistent atrial fibrillation: Secondary | ICD-10-CM

## 2021-02-12 DIAGNOSIS — I10 Essential (primary) hypertension: Secondary | ICD-10-CM | POA: Diagnosis not present

## 2021-02-12 NOTE — Progress Notes (Signed)
 Patient referred by Pharr, Walter, MD for atrial fibrillation  Subjective:   [name], female    DOB: 04/04/1935, 85 y.o.   MRN: [id number]   Chief Complaint  Patient presents with  . Atrial Fibrillation  . Hypertension    85 y.o. Caucasian female with hypertension, hyperlipidemia, persistent atrial fibrillation, moderate mitral regurgitation.  Patient is doing well. She denies chest pain, shortness of breath, palpitations, leg edema, orthopnea, PND, TIA/syncope. her blood pressure has always been higher at office visits, but lower at home.    Current Outpatient Medications on File Prior to Visit  Medication Sig Dispense Refill  . amLODipine (NORVASC) 5 MG tablet Take 5 mg by mouth daily.    . Cholecalciferol (VITAMIN D3) 25 MCG (1000 UT) CAPS Take by mouth daily.    . clonazePAM (KLONOPIN) 0.5 MG tablet Take 0.5 mg by mouth 2 (two) times daily as needed for anxiety.    . Cyanocobalamin (VITAMIN B12 PO) Take by mouth daily.    . irbesartan (AVAPRO) 300 MG tablet Take 300 mg by mouth daily.    . metoprolol succinate (TOPROL-XL) 100 MG 24 hr tablet TAKE 1 TABLET BY MOUTH ONCE A DAY. TAKE WITH OR IMMEDIATELY FOLLOWING A MEAL 90 tablet 3  . Multiple Vitamins-Minerals (MULTIVITAMIN ADULT PO) Take by mouth daily.    . NON FORMULARY Take by mouth daily. plexus    . Potassium 99 MG TABS Take by mouth daily.    . pravastatin (PRAVACHOL) 80 MG tablet Take 80 mg by mouth daily. 1/2 tab daily    . torsemide (DEMADEX) 20 MG tablet Take 20 mg by mouth daily. 1/2 tab daily    . Turmeric 1053 MG TABS Take by mouth.    . XARELTO 20 MG TABS tablet TAKE 1 TABLET BY MOUTH ONCE A DAY WITH SUPPER 90 tablet 1   No current facility-administered medications on file prior to visit.    Cardiovascular studies:  EKG 02/12/2021: Atrial fibrillation 73 bpm Diffuse low voltage Old anteroseptal infarct  Echocardiogram 07/31/2019: Left ventricle cavity is normal in size. Moderate concentric  hypertrophy of the left ventricle. Normal LV systolic function with EF 55%. Normal global wall motion. Unable to evaluate diastolic function due to atrial fibrillation.  Left atrial cavity is moderately dilated. Mild mitral valve leaflet calcification. Moderate (Grade III) mitral regurgitation. Moderate to severe tricuspid regurgitation. Moderate pulmonary hypertension. Estimated pulmonary artery systolic pressure is 46 mmHg.  IVC is dilated with a respiratory response of <50%. Estimated RA pressure 10-15 mmHg.  Recent labs: 07/18/2020: Glucose 100, BUN/Cr 20/0.96. EGFR 55. Na/K 143/5.1.  H/H 13/40. MCV 98. Platelets 143 Chol 133, TG 77, HDL 52, LDL 66 TSH 4.3 mildly elevated  01/11/2020: Glucose 88, BUN/Cr 18/0.7. EGFR 70 HbA1C N/A% Chol 132, TG 74, HDL 57, LDL 73 TSH 3.4 normal  07/17/2019: Glucose 88. BUN/Cr 22/0.79. eGFR 70. Na/K 142/4.9. Rest of the CMP normal. H/H 12.8/39.2. MCV 96. Platelets 159. Chol 138, TG 75, HDL 55, LDL 68.    Review of Systems  Cardiovascular: Negative for chest pain, dyspnea on exertion, leg swelling, palpitations and syncope.  Skin:       Bruising behind left knee        Vitals:   02/12/21 1020 02/12/21 1022  BP: (!) 193/99 (!) 182/94  Pulse: 69 70  Resp: 16   Temp: 98 F (36.7 C)   SpO2: 100%      Objective:   Physical Exam Vitals and nursing note reviewed.    Constitutional:      General: She is not in acute distress. Neck:     Vascular: No JVD.  Cardiovascular:     Rate and Rhythm: Normal rate. Rhythm irregular.     Pulses: Intact distal pulses.     Heart sounds: Normal heart sounds. No murmur heard.   Pulmonary:     Effort: Pulmonary effort is normal.     Breath sounds: Normal breath sounds. No wheezing or rales.  Skin:    Findings: Bruising (Behind left knee) present.         Assessment & Recommendations:   86 y.o. Caucasian female with hypertension, hyperlipidemia, persistent atrial fibrillation, moderate mitral  regurgitation  Persistent atrial fibrillation: Rate controlled, likely longstanding.  CHA2DS2VASc score 4, annual; stroke risk 5%. Continue Xarelto 20 mg daily.  Not significant bruising.   Recommend BMP at least once a year with PCP. Patient is completely asymptomatic. I will continue rate control therapy at this time. I do not think rhythm control is warranted, nor is it likely to sustain. Suspicion of ischemia is low, thus not performing stress test at this time.   Moderate mitral regurgitation, mild PH: Secondary to Afib. Clinically stable, asymptomatic  Hypertension: Has white coat hypertension with much lower blood pressure readings at home.  Continue amlodipine 5 mg, metoprolol succinate 100 mg daily, amlodipine 5 mg daily, irbesartan 300 mg daily.  Hyperlipidemia: Well-controlled.  F/u in 6 months  Genell Thede Esther Hardy, MD [facility] Pager: [phone] Office: [phone] If no answer Cell [phone]

## 2021-02-13 ENCOUNTER — Ambulatory Visit: Payer: PPO | Admitting: Cardiology

## 2021-02-13 DIAGNOSIS — E785 Hyperlipidemia, unspecified: Secondary | ICD-10-CM | POA: Diagnosis not present

## 2021-02-13 DIAGNOSIS — S92902G Unspecified fracture of left foot, subsequent encounter for fracture with delayed healing: Secondary | ICD-10-CM | POA: Diagnosis not present

## 2021-02-13 DIAGNOSIS — I10 Essential (primary) hypertension: Secondary | ICD-10-CM | POA: Diagnosis not present

## 2021-02-13 DIAGNOSIS — I4891 Unspecified atrial fibrillation: Secondary | ICD-10-CM | POA: Diagnosis not present

## 2021-02-13 DIAGNOSIS — E038 Other specified hypothyroidism: Secondary | ICD-10-CM | POA: Diagnosis not present

## 2021-02-18 DIAGNOSIS — I4891 Unspecified atrial fibrillation: Secondary | ICD-10-CM | POA: Diagnosis not present

## 2021-02-18 DIAGNOSIS — E785 Hyperlipidemia, unspecified: Secondary | ICD-10-CM | POA: Diagnosis not present

## 2021-02-18 DIAGNOSIS — I10 Essential (primary) hypertension: Secondary | ICD-10-CM | POA: Diagnosis not present

## 2021-03-11 DIAGNOSIS — E038 Other specified hypothyroidism: Secondary | ICD-10-CM | POA: Diagnosis not present

## 2021-03-18 DIAGNOSIS — L718 Other rosacea: Secondary | ICD-10-CM | POA: Diagnosis not present

## 2021-03-18 DIAGNOSIS — I4891 Unspecified atrial fibrillation: Secondary | ICD-10-CM | POA: Diagnosis not present

## 2021-03-18 DIAGNOSIS — S92902G Unspecified fracture of left foot, subsequent encounter for fracture with delayed healing: Secondary | ICD-10-CM | POA: Diagnosis not present

## 2021-03-18 DIAGNOSIS — I8393 Asymptomatic varicose veins of bilateral lower extremities: Secondary | ICD-10-CM | POA: Diagnosis not present

## 2021-03-18 DIAGNOSIS — L821 Other seborrheic keratosis: Secondary | ICD-10-CM | POA: Diagnosis not present

## 2021-03-18 DIAGNOSIS — E038 Other specified hypothyroidism: Secondary | ICD-10-CM | POA: Diagnosis not present

## 2021-03-18 DIAGNOSIS — L820 Inflamed seborrheic keratosis: Secondary | ICD-10-CM | POA: Diagnosis not present

## 2021-03-18 DIAGNOSIS — D225 Melanocytic nevi of trunk: Secondary | ICD-10-CM | POA: Diagnosis not present

## 2021-03-18 DIAGNOSIS — L814 Other melanin hyperpigmentation: Secondary | ICD-10-CM | POA: Diagnosis not present

## 2021-03-18 DIAGNOSIS — E785 Hyperlipidemia, unspecified: Secondary | ICD-10-CM | POA: Diagnosis not present

## 2021-03-18 DIAGNOSIS — I10 Essential (primary) hypertension: Secondary | ICD-10-CM | POA: Diagnosis not present

## 2021-03-20 DIAGNOSIS — E038 Other specified hypothyroidism: Secondary | ICD-10-CM | POA: Diagnosis not present

## 2021-03-20 DIAGNOSIS — I10 Essential (primary) hypertension: Secondary | ICD-10-CM | POA: Diagnosis not present

## 2021-03-20 DIAGNOSIS — I4891 Unspecified atrial fibrillation: Secondary | ICD-10-CM | POA: Diagnosis not present

## 2021-03-20 DIAGNOSIS — E785 Hyperlipidemia, unspecified: Secondary | ICD-10-CM | POA: Diagnosis not present

## 2021-04-10 ENCOUNTER — Other Ambulatory Visit: Payer: Self-pay | Admitting: Cardiology

## 2021-04-20 DIAGNOSIS — E785 Hyperlipidemia, unspecified: Secondary | ICD-10-CM | POA: Diagnosis not present

## 2021-04-20 DIAGNOSIS — I10 Essential (primary) hypertension: Secondary | ICD-10-CM | POA: Diagnosis not present

## 2021-04-20 DIAGNOSIS — I4891 Unspecified atrial fibrillation: Secondary | ICD-10-CM | POA: Diagnosis not present

## 2021-04-20 DIAGNOSIS — E038 Other specified hypothyroidism: Secondary | ICD-10-CM | POA: Diagnosis not present

## 2021-05-21 DIAGNOSIS — I4891 Unspecified atrial fibrillation: Secondary | ICD-10-CM | POA: Diagnosis not present

## 2021-05-21 DIAGNOSIS — I10 Essential (primary) hypertension: Secondary | ICD-10-CM | POA: Diagnosis not present

## 2021-05-21 DIAGNOSIS — E038 Other specified hypothyroidism: Secondary | ICD-10-CM | POA: Diagnosis not present

## 2021-05-21 DIAGNOSIS — E785 Hyperlipidemia, unspecified: Secondary | ICD-10-CM | POA: Diagnosis not present

## 2021-06-11 ENCOUNTER — Encounter: Payer: Self-pay | Admitting: Podiatry

## 2021-06-11 ENCOUNTER — Ambulatory Visit: Payer: PPO | Admitting: Podiatry

## 2021-06-11 ENCOUNTER — Ambulatory Visit (INDEPENDENT_AMBULATORY_CARE_PROVIDER_SITE_OTHER): Payer: PPO

## 2021-06-11 ENCOUNTER — Other Ambulatory Visit: Payer: Self-pay

## 2021-06-11 DIAGNOSIS — S9032XA Contusion of left foot, initial encounter: Secondary | ICD-10-CM

## 2021-06-11 DIAGNOSIS — G5792 Unspecified mononeuropathy of left lower limb: Secondary | ICD-10-CM

## 2021-06-11 NOTE — Progress Notes (Signed)
Subjective:  Patient ID: [name], female    DOB: [birth year],  MRN: [id number] HPI Chief Complaint  Patient presents with   Foot Injury    Dorsal midfoot left - fell 11/08/20, went to [facility]-xrayed, fracture, wore boot, still painful and swells, wears a compression stocking sometimes   New Patient (Initial Visit)    86 y.o. female presents with the above complaint.   ROS: Denies fever chills nausea vomit muscle aches pains calf pain back pain chest pain shortness of breath.  Past Medical History:  Diagnosis Date   Cancer ([place])    skin   Hyperlipidemia    Hypertension    Past Surgical History:  Procedure Laterality Date   ABDOMINAL HYSTERECTOMY     CHOLECYSTECTOMY     GALLBLADDER SURGERY      Current Outpatient Medications:    amLODipine (NORVASC) 5 MG tablet, Take 5 mg by mouth daily., Disp: , Rfl:    Cholecalciferol (VITAMIN D3) 25 MCG (1000 UT) CAPS, Take by mouth daily., Disp: , Rfl:    clonazePAM (KLONOPIN) 0.5 MG tablet, Take 0.5 mg by mouth 2 (two) times daily as needed for anxiety., Disp: , Rfl:    Cyanocobalamin (VITAMIN B12 PO), Take by mouth daily., Disp: , Rfl:    irbesartan (AVAPRO) 300 MG tablet, Take 300 mg by mouth daily., Disp: , Rfl:    metoprolol succinate (TOPROL-XL) 100 MG 24 hr tablet, TAKE 1 TABLET BY MOUTH ONCE A DAY. TAKE WITH OR IMMEDIATELY FOLLOWING A MEAL, Disp: 90 tablet, Rfl: 3   Multiple Vitamins-Minerals (MULTIVITAMIN ADULT PO), Take by mouth daily., Disp: , Rfl:    NON FORMULARY, Take by mouth daily. plexus, Disp: , Rfl:    Potassium 99 MG TABS, Take by mouth daily., Disp: , Rfl:    pravastatin (PRAVACHOL) 80 MG tablet, Take 80 mg by mouth daily. 1/2 tab daily, Disp: , Rfl:    torsemide (DEMADEX) 20 MG tablet, Take 20 mg by mouth daily. 1/2 tab daily, Disp: , Rfl:    Turmeric 1053 MG TABS, Take by mouth., Disp: , Rfl:    XARELTO 20 MG TABS tablet, TAKE 1 TABLET BY MOUTH ONCE A DAY WITH SUPPER, Disp: 90 tablet, Rfl: 1  Allergies   Allergen Reactions   Shellfish Allergy Anaphylaxis   Review of Systems Objective:  There were no vitals filed for this visit.  General: Well developed, nourished, in no acute distress, alert and oriented x3   Dermatological: Skin is warm, dry and supple bilateral. Nails x 10 are well maintained; remaining integument appears unremarkable at this time. There are no open sores, no preulcerative lesions, no rash or signs of infection present.  Postinflammatory hyperpigmentation dorsal lateral left foot.  Vascular: Dorsalis Pedis artery and Posterior Tibial artery pedal pulses are 2/4 bilateral with immedate capillary fill time. Pedal hair growth present. No varicosities and no lower extremity edema present bilateral.   Neruologic: Grossly intact via light touch bilateral. Vibratory intact via tuning fork bilateral. Protective threshold with Semmes Wienstein monofilament intact to all pedal sites bilateral. Patellar and Achilles deep tendon reflexes 2+ bilateral. No Babinski or clonus noted bilateral.   Musculoskeletal: No gross boney pedal deformities bilateral. No pain, crepitus, or limitation noted with foot and ankle range of motion bilateral. Muscular strength 5/5 in all groups tested bilateral.  No reproducible pain on palpation patient is states that the foot feels like is not hers.  Gait: Unassisted, Nonantalgic.    Radiographs:  Radiographs taken today do  not demonstrate any type of osseous abnormalities no fractures no acute findings.  Assessment & Plan:   Assessment: Most likely neurological issues or neuritis associated with her fall and the trauma as well as the swelling in the left foot at the time.  Plan: She will follow-up with me with any changes to this left foot.     Ivy Puryear T. [place], [place]

## 2021-07-21 DIAGNOSIS — I4891 Unspecified atrial fibrillation: Secondary | ICD-10-CM | POA: Diagnosis not present

## 2021-07-21 DIAGNOSIS — E785 Hyperlipidemia, unspecified: Secondary | ICD-10-CM | POA: Diagnosis not present

## 2021-07-21 DIAGNOSIS — E038 Other specified hypothyroidism: Secondary | ICD-10-CM | POA: Diagnosis not present

## 2021-07-21 DIAGNOSIS — I10 Essential (primary) hypertension: Secondary | ICD-10-CM | POA: Diagnosis not present

## 2021-07-22 DIAGNOSIS — E559 Vitamin D deficiency, unspecified: Secondary | ICD-10-CM | POA: Diagnosis not present

## 2021-07-22 DIAGNOSIS — E038 Other specified hypothyroidism: Secondary | ICD-10-CM | POA: Diagnosis not present

## 2021-07-22 DIAGNOSIS — E785 Hyperlipidemia, unspecified: Secondary | ICD-10-CM | POA: Diagnosis not present

## 2021-07-22 DIAGNOSIS — I10 Essential (primary) hypertension: Secondary | ICD-10-CM | POA: Diagnosis not present

## 2021-07-22 DIAGNOSIS — Z0000 Encounter for general adult medical examination without abnormal findings: Secondary | ICD-10-CM | POA: Diagnosis not present

## 2021-07-24 ENCOUNTER — Other Ambulatory Visit: Payer: Self-pay | Admitting: Internal Medicine

## 2021-07-24 DIAGNOSIS — Z1231 Encounter for screening mammogram for malignant neoplasm of breast: Secondary | ICD-10-CM

## 2021-07-29 DIAGNOSIS — Z7901 Long term (current) use of anticoagulants: Secondary | ICD-10-CM | POA: Diagnosis not present

## 2021-07-29 DIAGNOSIS — E559 Vitamin D deficiency, unspecified: Secondary | ICD-10-CM | POA: Diagnosis not present

## 2021-07-29 DIAGNOSIS — E038 Other specified hypothyroidism: Secondary | ICD-10-CM | POA: Diagnosis not present

## 2021-07-29 DIAGNOSIS — Z0000 Encounter for general adult medical examination without abnormal findings: Secondary | ICD-10-CM | POA: Diagnosis not present

## 2021-07-29 DIAGNOSIS — I4891 Unspecified atrial fibrillation: Secondary | ICD-10-CM | POA: Diagnosis not present

## 2021-07-29 DIAGNOSIS — E785 Hyperlipidemia, unspecified: Secondary | ICD-10-CM | POA: Diagnosis not present

## 2021-07-29 DIAGNOSIS — I10 Essential (primary) hypertension: Secondary | ICD-10-CM | POA: Diagnosis not present

## 2021-08-18 ENCOUNTER — Ambulatory Visit: Payer: PPO | Admitting: Cardiology

## 2021-08-28 ENCOUNTER — Ambulatory Visit: Payer: PPO

## 2021-09-24 ENCOUNTER — Ambulatory Visit
Admission: RE | Admit: 2021-09-24 | Discharge: 2021-09-24 | Disposition: A | Discharge status: Home / Self Care | Payer: PPO | Source: Ambulatory Visit | Attending: Internal Medicine | Admitting: Internal Medicine

## 2021-09-24 DIAGNOSIS — Z1231 Encounter for screening mammogram for malignant neoplasm of breast: Secondary | ICD-10-CM

## 2021-09-26 ENCOUNTER — Ambulatory Visit: Payer: PPO

## 2021-10-07 NOTE — Progress Notes (Signed)
Patient referred by [name], MD for atrial fibrillation  Subjective:   [name], female    DOB: 04-26-1935, 87 y.o.   MRN: [id number]   Chief Complaint  Patient presents with   Atrial Fibrillation   Follow-up    87 y.o. Caucasian female with hypertension, hyperlipidemia, persistent atrial fibrillation, moderate mitral regurgitation.  Unfortunately, patient lost her husband Jenny Reichmann in November 2022.  Patient was John's primary caregiver wife of 68 years. Following this, her best friend developed rapidly deteriorating Alzheimer's dementia and does not recognize the patient anymore.  Eventually, patient been struggling to cope with these events.  For her safety and wellbeing, child made a decision to move forward to retirement community.  She likes the company there, but does not enjoy the food, but reportedly has high salt content.  Blood pressure elevated today, but as it has always been, is normal at home and higher at her office visits.  She has easy bruising, but denies any major melena, rectal bleeding issues.   Current Outpatient Medications on File Prior to Visit  Medication Sig Dispense Refill   amLODipine (NORVASC) 5 MG tablet Take 5 mg by mouth daily.     Cholecalciferol (VITAMIN D3) 25 MCG (1000 UT) CAPS Take by mouth daily.     clonazePAM (KLONOPIN) 0.5 MG tablet Take 0.5 mg by mouth 2 (two) times daily as needed for anxiety.     Cyanocobalamin (VITAMIN B12 PO) Take by mouth daily.     irbesartan (AVAPRO) 300 MG tablet Take 300 mg by mouth daily.     metoprolol succinate (TOPROL-XL) 100 MG 24 hr tablet TAKE 1 TABLET BY MOUTH ONCE A DAY. TAKE WITH OR IMMEDIATELY FOLLOWING A MEAL 90 tablet 3   Multiple Vitamins-Minerals (MULTIVITAMIN ADULT PO) Take by mouth daily.     NON FORMULARY Take by mouth daily. plexus     Potassium 99 MG TABS Take by mouth daily.     pravastatin (PRAVACHOL) 80 MG tablet Take 80 mg by mouth daily. 1/2 tab daily     torsemide (DEMADEX) 20 MG  tablet Take 20 mg by mouth daily. 1/2 tab daily     Turmeric 1053 MG TABS Take by mouth.     XARELTO 20 MG TABS tablet TAKE 1 TABLET BY MOUTH ONCE A DAY WITH SUPPER 90 tablet 1   No current facility-administered medications on file prior to visit.    Cardiovascular studies:  EKG 10/08/2021: Atrial fibrillation  Diffuse low voltage Anteroseptal infarct -age undetermined  Echocardiogram 07/31/2019: Left ventricle cavity is normal in size. Moderate concentric hypertrophy of the left ventricle. Normal LV systolic function with EF 55%. Normal global wall motion. Unable to evaluate diastolic function due to atrial fibrillation.  Left atrial cavity is moderately dilated. Mild mitral valve leaflet calcification. Moderate (Grade III) mitral regurgitation. Moderate to severe tricuspid regurgitation. Moderate pulmonary hypertension. Estimated pulmonary artery systolic pressure is 46 mmHg.  IVC is dilated with a respiratory response of <50%. Estimated RA pressure 10-15 mmHg.  Recent labs: 07/22/2021: Glucose 99, BUN/Cr 22/0.84. EGFR 64. Na/K 142/4.1. Rest of the CMP normal H/H 12/37. MCV 97. Platelets 151 Chol 124, TG 64, HDL 55, LDL 56 TSH 5.3 high  07/18/2020: Glucose 100, BUN/Cr 20/0.96. EGFR 55. Na/K 143/5.1.  H/H 13/40. MCV 98. Platelets 143 Chol 133, TG 77, HDL 52, LDL 66 TSH 4.3 mildly elevated  01/11/2020: Glucose 88, BUN/Cr 18/0.7. EGFR 70 HbA1C N/A% Chol 132, TG 74, HDL 57, LDL 73 TSH 3.4 normal  07/17/2019: Glucose 88. BUN/Cr 22/0.79. eGFR 70. Na/K 142/4.9. Rest of the CMP normal. H/H 12.8/39.2. MCV 96. Platelets 159. Chol 138, TG 75, HDL 55, LDL 68.    Review of Systems  Cardiovascular:  Negative for chest pain, dyspnea on exertion, leg swelling, palpitations and syncope.  Skin:        Bruising behind left knee       Vitals:   10/08/21 0815 10/08/21 0830  BP: (!) 174/92 (!) 176/86  Pulse: 62 72  Temp: 97.8 F (36.6 C)   SpO2: 98% 100%     Objective:    Physical Exam Vitals and nursing note reviewed.  Constitutional:      General: She is not in acute distress. Neck:     Vascular: No JVD.  Cardiovascular:     Rate and Rhythm: Normal rate. Rhythm irregular.     Pulses: Intact distal pulses.     Heart sounds: Normal heart sounds. No murmur heard. Pulmonary:     Effort: Pulmonary effort is normal.     Breath sounds: Normal breath sounds. No wheezing or rales.  Skin:    Findings: Bruising (forearms) present.        Assessment & Recommendations:   87 y.o. Caucasian female with hypertension, hyperlipidemia, persistent atrial fibrillation, moderate mitral regurgitation  Persistent atrial fibrillation: Rate controlled, likely longstanding.  CHA2DS2VASc score 4, annual; stroke risk 5%. Continue Xarelto 20 mg daily.  Not significant bruising.   Recommend BMP at least once a year with PCP, bili within normal limits. Patient is completely asymptomatic. I will continue rate control therapy at this time. I do not think rhythm control is warranted, nor is it likely to sustain. Suspicion of ischemia is low, thus not performing stress test at this time.   Moderate mitral regurgitation, mild PH: Secondary to Afib. Clinically stable, asymptomatic  Hypertension: Has white coat hypertension with much lower blood pressure readings at home.  No change made to her baseline antihypertensive therapy today.    Hyperlipidemia: Well-controlled.  I wished her well to go through her recent loss.  F/u in 6 months  Ngan Qualls Esther Hardy, MD [facility] Pager: [phone] Office: [phone] If no answer Cell [phone]

## 2021-10-08 ENCOUNTER — Encounter: Payer: Self-pay | Admitting: Cardiology

## 2021-10-08 ENCOUNTER — Other Ambulatory Visit: Payer: Self-pay | Admitting: Cardiology

## 2021-10-08 ENCOUNTER — Ambulatory Visit: Payer: PPO | Admitting: Cardiology

## 2021-10-08 ENCOUNTER — Other Ambulatory Visit: Payer: Self-pay

## 2021-10-08 VITALS — BP 176/86 | HR 72 | Temp 97.8°F | Ht 67.0 in | Wt 170.0 lb

## 2021-10-08 DIAGNOSIS — I10 Essential (primary) hypertension: Secondary | ICD-10-CM

## 2021-10-08 DIAGNOSIS — I4819 Other persistent atrial fibrillation: Secondary | ICD-10-CM | POA: Diagnosis not present

## 2021-10-08 DIAGNOSIS — M25561 Pain in right knee: Secondary | ICD-10-CM | POA: Diagnosis not present

## 2021-10-08 DIAGNOSIS — M25461 Effusion, right knee: Secondary | ICD-10-CM | POA: Diagnosis not present

## 2021-10-08 DIAGNOSIS — R262 Difficulty in walking, not elsewhere classified: Secondary | ICD-10-CM | POA: Diagnosis not present

## 2021-10-08 DIAGNOSIS — M1711 Unilateral primary osteoarthritis, right knee: Secondary | ICD-10-CM | POA: Diagnosis not present

## 2021-10-08 DIAGNOSIS — E782 Mixed hyperlipidemia: Secondary | ICD-10-CM

## 2021-10-08 DIAGNOSIS — M6281 Muscle weakness (generalized): Secondary | ICD-10-CM | POA: Diagnosis not present

## 2021-10-13 DIAGNOSIS — M25561 Pain in right knee: Secondary | ICD-10-CM | POA: Diagnosis not present

## 2021-10-13 DIAGNOSIS — M6281 Muscle weakness (generalized): Secondary | ICD-10-CM | POA: Diagnosis not present

## 2021-10-13 DIAGNOSIS — R262 Difficulty in walking, not elsewhere classified: Secondary | ICD-10-CM | POA: Diagnosis not present

## 2021-10-13 DIAGNOSIS — M1711 Unilateral primary osteoarthritis, right knee: Secondary | ICD-10-CM | POA: Diagnosis not present

## 2021-10-13 DIAGNOSIS — M25461 Effusion, right knee: Secondary | ICD-10-CM | POA: Diagnosis not present

## 2021-10-21 DIAGNOSIS — M1711 Unilateral primary osteoarthritis, right knee: Secondary | ICD-10-CM | POA: Diagnosis not present

## 2021-10-21 DIAGNOSIS — M6281 Muscle weakness (generalized): Secondary | ICD-10-CM | POA: Diagnosis not present

## 2021-10-21 DIAGNOSIS — M25561 Pain in right knee: Secondary | ICD-10-CM | POA: Diagnosis not present

## 2021-10-21 DIAGNOSIS — M25461 Effusion, right knee: Secondary | ICD-10-CM | POA: Diagnosis not present

## 2021-10-21 DIAGNOSIS — R262 Difficulty in walking, not elsewhere classified: Secondary | ICD-10-CM | POA: Diagnosis not present

## 2021-10-23 DIAGNOSIS — M25561 Pain in right knee: Secondary | ICD-10-CM | POA: Diagnosis not present

## 2021-10-23 DIAGNOSIS — M6281 Muscle weakness (generalized): Secondary | ICD-10-CM | POA: Diagnosis not present

## 2021-10-23 DIAGNOSIS — M25461 Effusion, right knee: Secondary | ICD-10-CM | POA: Diagnosis not present

## 2021-10-23 DIAGNOSIS — M1711 Unilateral primary osteoarthritis, right knee: Secondary | ICD-10-CM | POA: Diagnosis not present

## 2021-10-23 DIAGNOSIS — R262 Difficulty in walking, not elsewhere classified: Secondary | ICD-10-CM | POA: Diagnosis not present

## 2021-10-28 DIAGNOSIS — M25461 Effusion, right knee: Secondary | ICD-10-CM | POA: Diagnosis not present

## 2021-10-28 DIAGNOSIS — M1711 Unilateral primary osteoarthritis, right knee: Secondary | ICD-10-CM | POA: Diagnosis not present

## 2021-10-28 DIAGNOSIS — M6281 Muscle weakness (generalized): Secondary | ICD-10-CM | POA: Diagnosis not present

## 2021-10-28 DIAGNOSIS — M25561 Pain in right knee: Secondary | ICD-10-CM | POA: Diagnosis not present

## 2021-10-28 DIAGNOSIS — R262 Difficulty in walking, not elsewhere classified: Secondary | ICD-10-CM | POA: Diagnosis not present

## 2021-11-05 DIAGNOSIS — Z8739 Personal history of other diseases of the musculoskeletal system and connective tissue: Secondary | ICD-10-CM | POA: Diagnosis not present

## 2021-11-05 DIAGNOSIS — M25521 Pain in right elbow: Secondary | ICD-10-CM | POA: Diagnosis not present

## 2021-11-05 DIAGNOSIS — F321 Major depressive disorder, single episode, moderate: Secondary | ICD-10-CM | POA: Diagnosis not present

## 2021-11-07 DIAGNOSIS — M25521 Pain in right elbow: Secondary | ICD-10-CM | POA: Diagnosis not present

## 2021-11-07 DIAGNOSIS — F321 Major depressive disorder, single episode, moderate: Secondary | ICD-10-CM | POA: Diagnosis not present

## 2021-11-07 DIAGNOSIS — Z8739 Personal history of other diseases of the musculoskeletal system and connective tissue: Secondary | ICD-10-CM | POA: Diagnosis not present

## 2021-11-12 ENCOUNTER — Encounter: Payer: Self-pay | Admitting: Podiatry

## 2021-11-12 ENCOUNTER — Ambulatory Visit: Payer: PPO | Admitting: Podiatry

## 2021-11-12 ENCOUNTER — Other Ambulatory Visit: Payer: Self-pay

## 2021-11-12 DIAGNOSIS — D2371 Other benign neoplasm of skin of right lower limb, including hip: Secondary | ICD-10-CM

## 2021-11-12 DIAGNOSIS — G5792 Unspecified mononeuropathy of left lower limb: Secondary | ICD-10-CM | POA: Diagnosis not present

## 2021-11-12 NOTE — Progress Notes (Signed)
She presents today for concern of her fifth toe primarily where she is got a corn this been rubbing and painful.  Objective: Vital signs stable alert and oriented x3 left foot demonstrates postinflammatory hyperpigmentation secondary to her fracture last year.  Fifth digit right foot does demonstrate a superficial corn to the distal medial aspect of the fifth toe exquisitely tender to palpation no cellulitis no open lesions or wounds.  Assessment: Pain in limb secondary to benign skin lesion right fifth toe.  Plan: Debridement of skin lesion right fifth toe and placed padding and a silicone shield.

## 2021-11-14 DIAGNOSIS — M109 Gout, unspecified: Secondary | ICD-10-CM | POA: Diagnosis not present

## 2021-11-14 DIAGNOSIS — F321 Major depressive disorder, single episode, moderate: Secondary | ICD-10-CM | POA: Diagnosis not present

## 2022-01-27 DIAGNOSIS — E785 Hyperlipidemia, unspecified: Secondary | ICD-10-CM | POA: Diagnosis not present

## 2022-01-27 DIAGNOSIS — E559 Vitamin D deficiency, unspecified: Secondary | ICD-10-CM | POA: Diagnosis not present

## 2022-02-03 DIAGNOSIS — E038 Other specified hypothyroidism: Secondary | ICD-10-CM | POA: Diagnosis not present

## 2022-02-03 DIAGNOSIS — E559 Vitamin D deficiency, unspecified: Secondary | ICD-10-CM | POA: Diagnosis not present

## 2022-02-03 DIAGNOSIS — E785 Hyperlipidemia, unspecified: Secondary | ICD-10-CM | POA: Diagnosis not present

## 2022-02-03 DIAGNOSIS — I10 Essential (primary) hypertension: Secondary | ICD-10-CM | POA: Diagnosis not present

## 2022-02-03 DIAGNOSIS — Z7901 Long term (current) use of anticoagulants: Secondary | ICD-10-CM | POA: Diagnosis not present

## 2022-02-03 DIAGNOSIS — F321 Major depressive disorder, single episode, moderate: Secondary | ICD-10-CM | POA: Diagnosis not present

## 2022-02-03 DIAGNOSIS — Z0000 Encounter for general adult medical examination without abnormal findings: Secondary | ICD-10-CM | POA: Diagnosis not present

## 2022-02-03 DIAGNOSIS — I4891 Unspecified atrial fibrillation: Secondary | ICD-10-CM | POA: Diagnosis not present

## 2022-03-18 DIAGNOSIS — L814 Other melanin hyperpigmentation: Secondary | ICD-10-CM | POA: Diagnosis not present

## 2022-03-18 DIAGNOSIS — L821 Other seborrheic keratosis: Secondary | ICD-10-CM | POA: Diagnosis not present

## 2022-03-18 DIAGNOSIS — I872 Venous insufficiency (chronic) (peripheral): Secondary | ICD-10-CM | POA: Diagnosis not present

## 2022-03-18 DIAGNOSIS — R233 Spontaneous ecchymoses: Secondary | ICD-10-CM | POA: Diagnosis not present

## 2022-03-18 DIAGNOSIS — L570 Actinic keratosis: Secondary | ICD-10-CM | POA: Diagnosis not present

## 2022-03-18 DIAGNOSIS — D225 Melanocytic nevi of trunk: Secondary | ICD-10-CM | POA: Diagnosis not present

## 2022-03-23 DIAGNOSIS — R262 Difficulty in walking, not elsewhere classified: Secondary | ICD-10-CM | POA: Diagnosis not present

## 2022-03-23 DIAGNOSIS — M6281 Muscle weakness (generalized): Secondary | ICD-10-CM | POA: Diagnosis not present

## 2022-03-23 DIAGNOSIS — M1711 Unilateral primary osteoarthritis, right knee: Secondary | ICD-10-CM | POA: Diagnosis not present

## 2022-03-23 DIAGNOSIS — M25561 Pain in right knee: Secondary | ICD-10-CM | POA: Diagnosis not present

## 2022-03-23 DIAGNOSIS — M25461 Effusion, right knee: Secondary | ICD-10-CM | POA: Diagnosis not present

## 2022-03-25 ENCOUNTER — Other Ambulatory Visit: Payer: Self-pay | Admitting: Cardiology

## 2022-03-25 DIAGNOSIS — M1711 Unilateral primary osteoarthritis, right knee: Secondary | ICD-10-CM | POA: Diagnosis not present

## 2022-03-25 DIAGNOSIS — M25461 Effusion, right knee: Secondary | ICD-10-CM | POA: Diagnosis not present

## 2022-03-25 DIAGNOSIS — M25561 Pain in right knee: Secondary | ICD-10-CM | POA: Diagnosis not present

## 2022-03-25 DIAGNOSIS — M6281 Muscle weakness (generalized): Secondary | ICD-10-CM | POA: Diagnosis not present

## 2022-03-25 DIAGNOSIS — R262 Difficulty in walking, not elsewhere classified: Secondary | ICD-10-CM | POA: Diagnosis not present

## 2022-03-30 DIAGNOSIS — M25461 Effusion, right knee: Secondary | ICD-10-CM | POA: Diagnosis not present

## 2022-03-30 DIAGNOSIS — M25561 Pain in right knee: Secondary | ICD-10-CM | POA: Diagnosis not present

## 2022-03-30 DIAGNOSIS — R262 Difficulty in walking, not elsewhere classified: Secondary | ICD-10-CM | POA: Diagnosis not present

## 2022-03-30 DIAGNOSIS — M6281 Muscle weakness (generalized): Secondary | ICD-10-CM | POA: Diagnosis not present

## 2022-03-30 DIAGNOSIS — M1711 Unilateral primary osteoarthritis, right knee: Secondary | ICD-10-CM | POA: Diagnosis not present

## 2022-04-01 DIAGNOSIS — M25561 Pain in right knee: Secondary | ICD-10-CM | POA: Diagnosis not present

## 2022-04-01 DIAGNOSIS — R262 Difficulty in walking, not elsewhere classified: Secondary | ICD-10-CM | POA: Diagnosis not present

## 2022-04-01 DIAGNOSIS — M6281 Muscle weakness (generalized): Secondary | ICD-10-CM | POA: Diagnosis not present

## 2022-04-01 DIAGNOSIS — M1711 Unilateral primary osteoarthritis, right knee: Secondary | ICD-10-CM | POA: Diagnosis not present

## 2022-04-01 DIAGNOSIS — M25461 Effusion, right knee: Secondary | ICD-10-CM | POA: Diagnosis not present

## 2022-04-07 DIAGNOSIS — M25461 Effusion, right knee: Secondary | ICD-10-CM | POA: Diagnosis not present

## 2022-04-07 DIAGNOSIS — M6281 Muscle weakness (generalized): Secondary | ICD-10-CM | POA: Diagnosis not present

## 2022-04-07 DIAGNOSIS — M1711 Unilateral primary osteoarthritis, right knee: Secondary | ICD-10-CM | POA: Diagnosis not present

## 2022-04-07 DIAGNOSIS — M25561 Pain in right knee: Secondary | ICD-10-CM | POA: Diagnosis not present

## 2022-04-07 DIAGNOSIS — R262 Difficulty in walking, not elsewhere classified: Secondary | ICD-10-CM | POA: Diagnosis not present

## 2022-04-08 ENCOUNTER — Ambulatory Visit: Payer: PPO | Admitting: Cardiology

## 2022-04-09 DIAGNOSIS — L03116 Cellulitis of left lower limb: Secondary | ICD-10-CM | POA: Diagnosis not present

## 2022-04-10 ENCOUNTER — Ambulatory Visit: Payer: PPO | Admitting: Cardiology

## 2022-04-16 DIAGNOSIS — R600 Localized edema: Secondary | ICD-10-CM | POA: Diagnosis not present

## 2022-04-22 IMAGING — MG MM DIGITAL SCREENING BILAT W/ TOMO AND CAD
8 series · 8 of 24 positions shown · non-contrast
Comparison: Previous exam(s).

CLINICAL DATA: Screening.

EXAM:
DIGITAL SCREENING BILATERAL MAMMOGRAM WITH TOMOSYNTHESIS AND CAD
TECHNIQUE: Bilateral screening digital craniocaudal and mediolateral oblique
mammograms were obtained. Bilateral screening digital breast
tomosynthesis was performed. The images were evaluated with
computer-aided detection.

[L CC synth-2D]
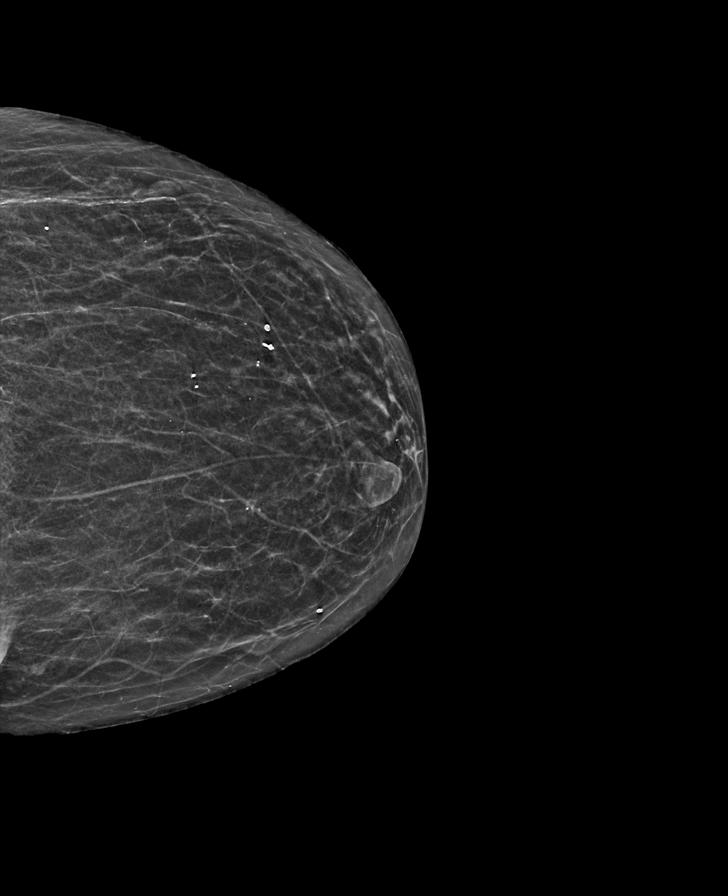

[R CC synth-2D]
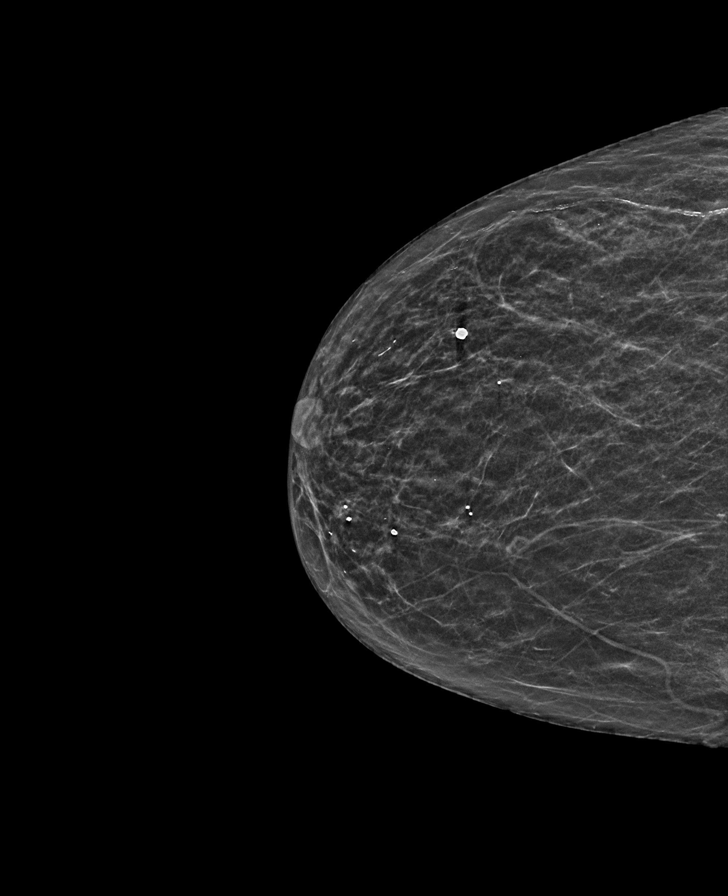

[R MLO synth-2D]
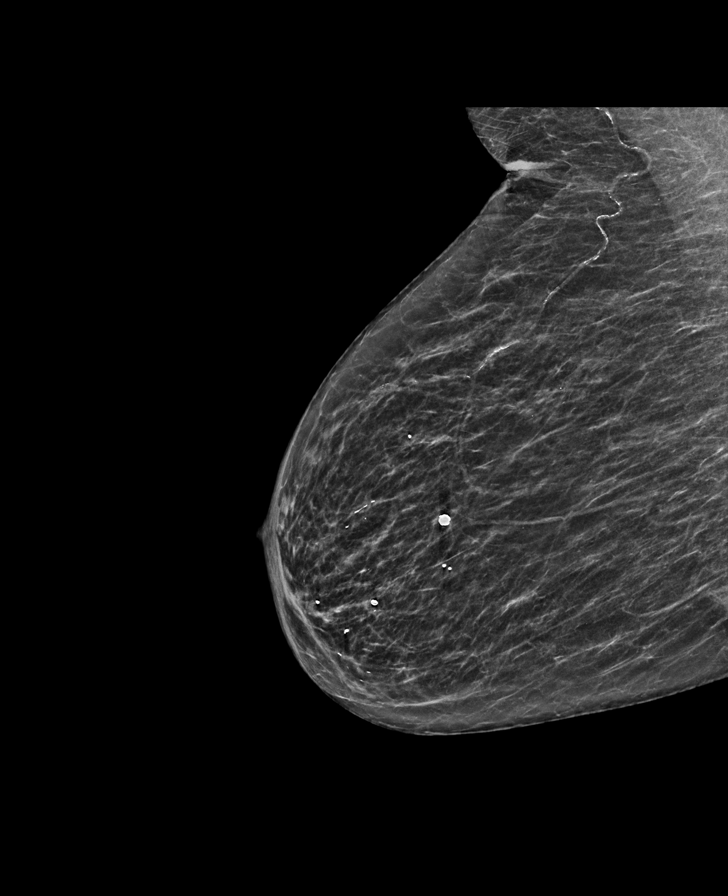

[L MLO synth-2D]
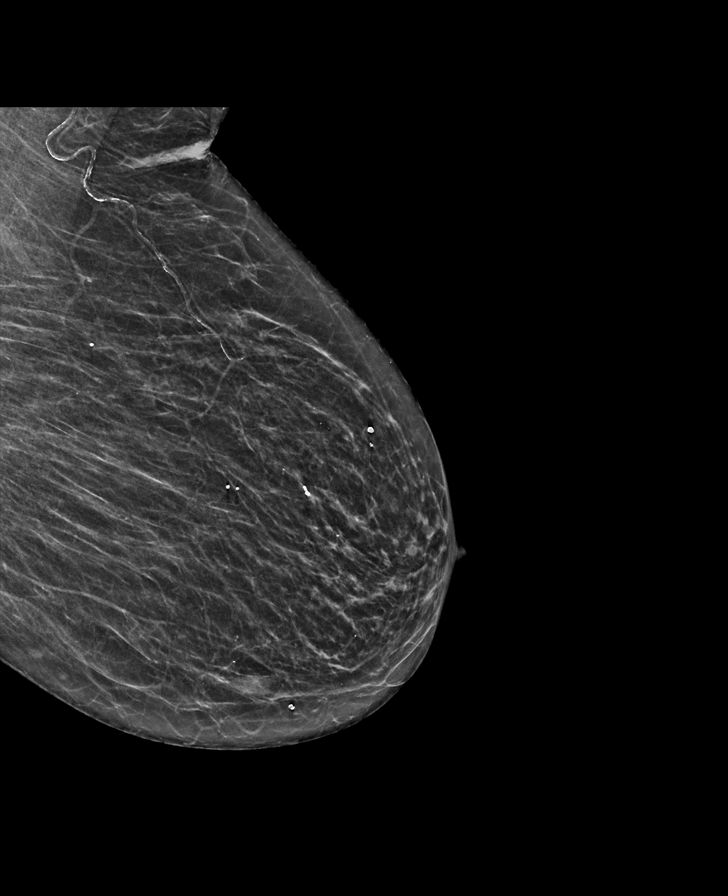

[L CC tomo · tomo slice 23/44.0]
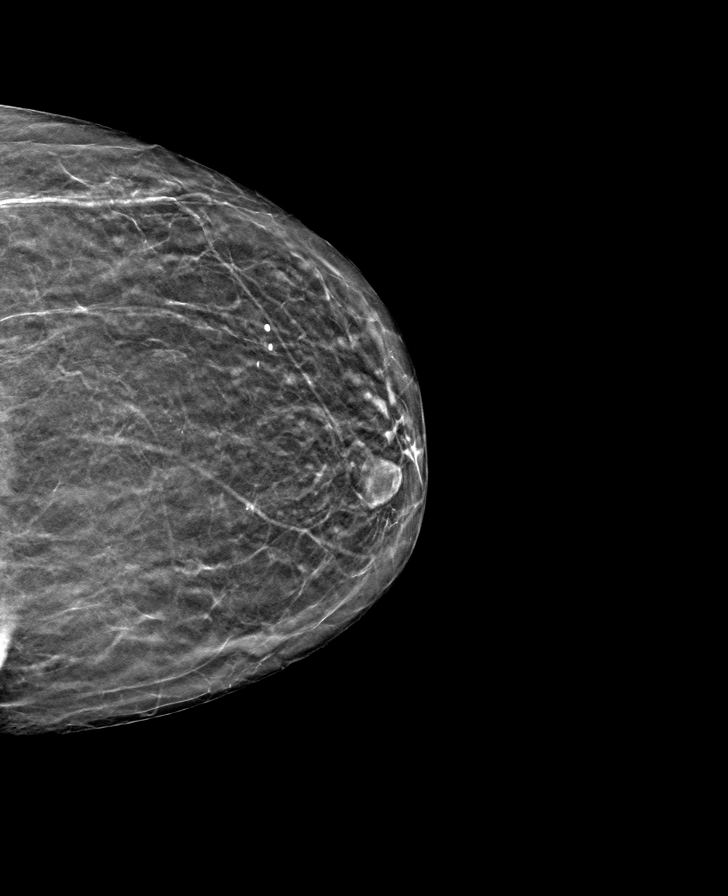

[R CC tomo · tomo slice 23/44.0]
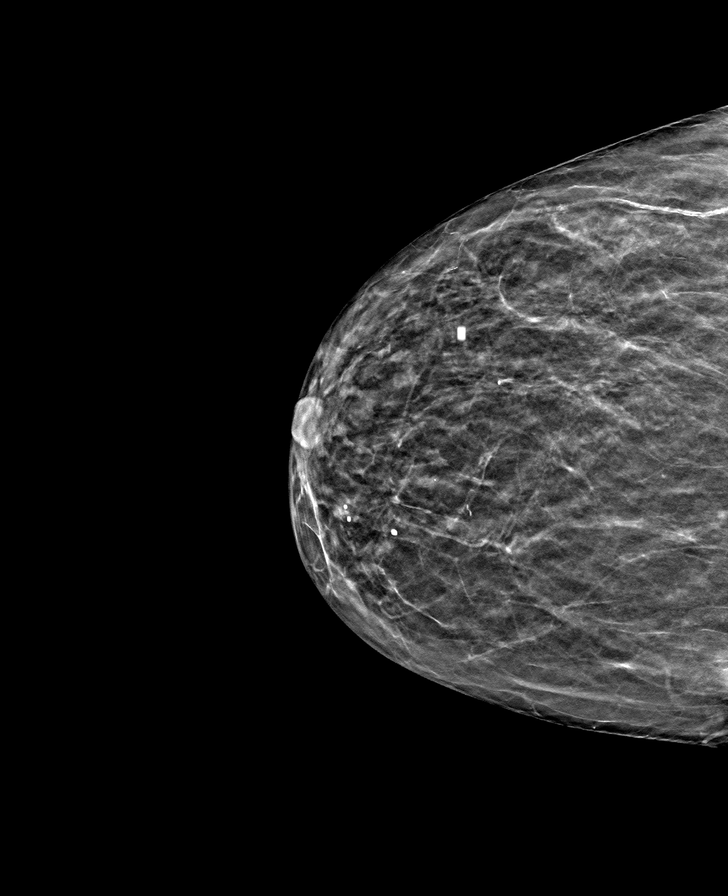

[R MLO tomo · tomo slice 27/52.0]
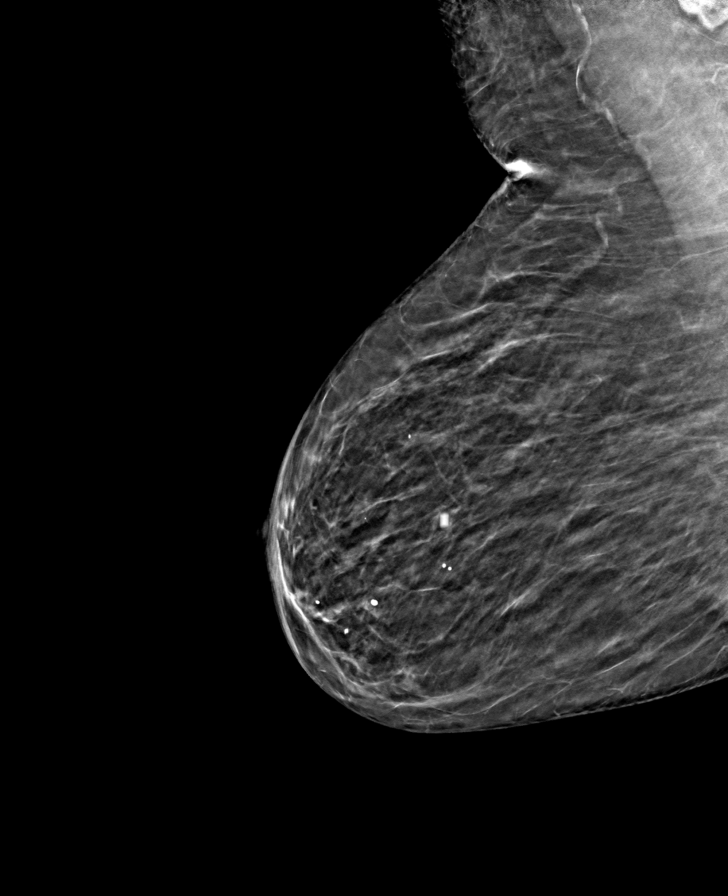

[L MLO tomo · tomo slice 25/48.0]
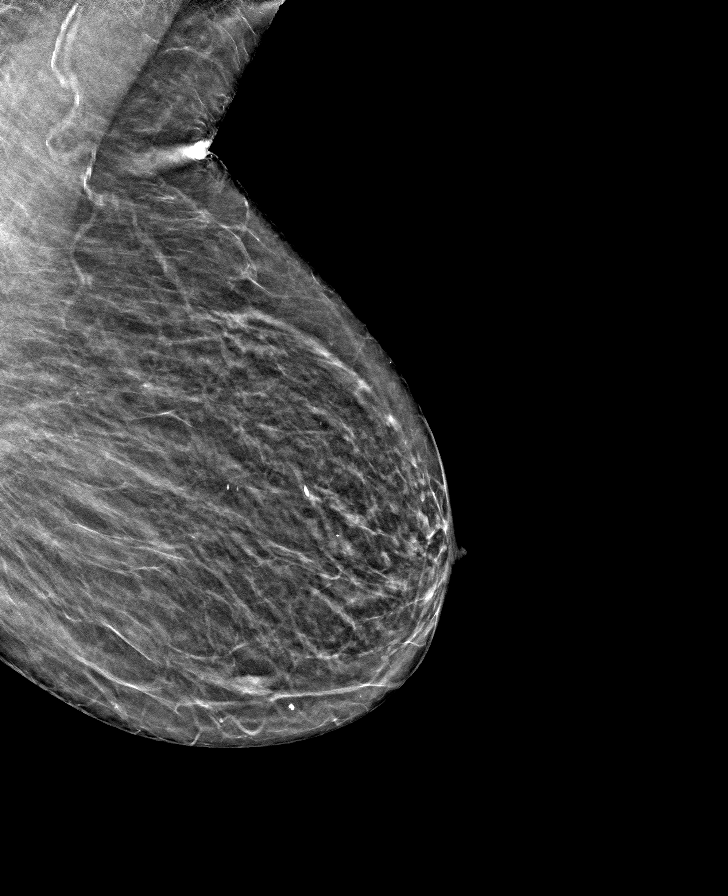

[8 of 24 positions shown; findings below may reference images not displayed]

ACR Breast Density Category b: There are scattered areas of
fibroglandular density.
FINDINGS: There are no findings suspicious for malignancy.
IMPRESSION: No mammographic evidence of malignancy. A result letter of this
screening mammogram will be mailed directly to the patient.

RECOMMENDATION:
Screening mammogram in one year. (Code:51-O-LD2)

BI-RADS CATEGORY  1: Negative.

## 2022-05-12 DIAGNOSIS — M25562 Pain in left knee: Secondary | ICD-10-CM | POA: Diagnosis not present

## 2022-05-12 DIAGNOSIS — M25561 Pain in right knee: Secondary | ICD-10-CM | POA: Diagnosis not present

## 2022-05-21 ENCOUNTER — Encounter: Payer: Self-pay | Admitting: Cardiology

## 2022-05-21 ENCOUNTER — Ambulatory Visit: Payer: PPO | Admitting: Cardiology

## 2022-05-21 VITALS — BP 135/74 | HR 65 | Temp 98.0°F | Resp 16 | Ht 67.0 in | Wt 173.0 lb

## 2022-05-21 DIAGNOSIS — I4819 Other persistent atrial fibrillation: Secondary | ICD-10-CM

## 2022-05-21 DIAGNOSIS — Z006 Encounter for examination for normal comparison and control in clinical research program: Secondary | ICD-10-CM

## 2022-05-21 NOTE — Progress Notes (Signed)
Patient referred by [name], MD for atrial fibrillation  Subjective:   [name], female    DOB: November 29, 1934, 87 y.o.   MRN: [id number]   Chief Complaint  Patient presents with   Atrial Fibrillation   Follow-up    85 month    87 y.o. Caucasian female with hypertension, hyperlipidemia, persistent atrial fibrillation, moderate mitral regurgitation.  Patient was a little emotional today as it is her late husband's birthday who passed last year.  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  She is still driving, drove from [place] today.  She denies any complaints.  Blood pressure is well controlled.  Of note, she is on enrolled in oceanic AF clinical trial.   Current Outpatient Medications:    amLODipine (NORVASC) 5 MG tablet, Take 5 mg by mouth daily., Disp: , Rfl:    Cholecalciferol (VITAMIN D3) 25 MCG (1000 UT) CAPS, Take by mouth daily., Disp: , Rfl:    clonazePAM (KLONOPIN) 0.5 MG tablet, Take 0.5 mg by mouth 2 (two) times daily as needed for anxiety., Disp: , Rfl:    Cyanocobalamin (VITAMIN B12 PO), Take by mouth daily., Disp: , Rfl:    irbesartan (AVAPRO) 300 MG tablet, Take 300 mg by mouth daily., Disp: , Rfl:    metoprolol succinate (TOPROL-XL) 100 MG 24 hr tablet, TAKE 1 TABLET BY MOUTH ONCE A DAY. TAKE WITH OR IMMEDIATELY FOLLOWING A MEAL, Disp: 90 tablet, Rfl: 3   Multiple Vitamins-Minerals (MULTIVITAMIN ADULT PO), Take by mouth daily., Disp: , Rfl:    NON FORMULARY, Take by mouth daily. plexus, Disp: , Rfl:    Potassium 99 MG TABS, Take by mouth daily., Disp: , Rfl:    pravastatin (PRAVACHOL) 80 MG tablet, Take 80 mg by mouth daily. 1/2 tab daily, Disp: , Rfl:    torsemide (DEMADEX) 20 MG tablet, Take 20 mg by mouth daily. 1/2 tab daily, Disp: , Rfl:    Turmeric 1053 MG TABS, Take by mouth., Disp: , Rfl:    XARELTO 20 MG TABS tablet, TAKE 1 TABLET BY MOUTH ONCE A DAY WITH SUPPER, Disp: 90 tablet, Rfl: 1    Cardiovascular  studies:  EKG 10/08/2021: Atrial fibrillation  Diffuse low voltage Anteroseptal infarct -age undetermined  Echocardiogram 07/31/2019: Left ventricle cavity is normal in size. Moderate concentric hypertrophy of the left ventricle. Normal LV systolic function with EF 55%. Normal global wall motion. Unable to evaluate diastolic function due to atrial fibrillation.  Left atrial cavity is moderately dilated. Mild mitral valve leaflet calcification. Moderate (Grade III) mitral regurgitation. Moderate to severe tricuspid regurgitation. Moderate pulmonary hypertension. Estimated pulmonary artery systolic pressure is 46 mmHg.  IVC is dilated with a respiratory response of <50%. Estimated RA pressure 10-15 mmHg.  Recent labs: 01/27/2022: Glucose 95, BUN/Cr 28/1.04. EGFR 49. Na/K 140/4.4. Rest of the CMP normal Chol 139, TG 47, HDL 66, LDL 64  07/22/2021: Glucose 99, BUN/Cr 22/0.84. EGFR 64. Na/K 142/4.1. Rest of the CMP normal H/H 12/37. MCV 97. Platelets 151 Chol 124, TG 64, HDL 55, LDL 56 TSH 5.3 high  07/18/2020: Glucose 100, BUN/Cr 20/0.96. EGFR 55. Na/K 143/5.1.  H/H 13/40. MCV 98. Platelets 143 Chol 133, TG 77, HDL 52, LDL 66 TSH 4.3 mildly elevated  01/11/2020: Glucose 88, BUN/Cr 18/0.7. EGFR 70 HbA1C N/A% Chol 132, TG 74, HDL 57, LDL 73 TSH 3.4 normal  07/17/2019: Glucose 88. BUN/Cr 22/0.79. eGFR 70. Na/K 142/4.9. Rest of the CMP normal. H/H 12.8/39.2.  MCV 96. Platelets 159. Chol 138, TG 75, HDL 55, LDL 68.    Review of Systems  Cardiovascular:  Negative for chest pain, dyspnea on exertion, leg swelling, palpitations and syncope.  Skin:        Bruising behind left knee        Vitals:   05/21/22 0915 05/21/22 0924  BP: (!) 166/101 135/74  Pulse: 60 65  Resp:    Temp:    SpO2:       Objective:   Physical Exam Vitals and nursing note reviewed.  Constitutional:      General: She is not in acute distress. Neck:     Vascular: No JVD.  Cardiovascular:     Rate  and Rhythm: Normal rate. Rhythm irregular.     Pulses: Intact distal pulses.     Heart sounds: Normal heart sounds. No murmur heard. Pulmonary:     Effort: Pulmonary effort is normal.     Breath sounds: Normal breath sounds. No wheezing or rales.  Skin:    Findings: Bruising (forearms) present.         Assessment & Recommendations:   87 y.o. Caucasian female with hypertension, hyperlipidemia, persistent atrial fibrillation, moderate mitral regurgitation  Persistent atrial fibrillation: Rate controlled, likely longstanding.  CHA2DS2VASc score 4, annual; stroke risk 5%. Enrolled in [employer]CE[employer]IC AF clinical trial. Patient is completely asymptomatic. I will continue rate control therapy at this time. I do not think rhythm control is warranted, nor is it likely to sustain. Suspicion of ischemia is low, thus not performing stress test at this time.   Moderate mitral regurgitation, mild PH: Secondary to Afib. Clinically stable, asymptomatic  Hypertension: Well-controlled  Hyperlipidemia: Well-controlled.  F/u in 1 year   Nigel Mormon, MD Pager: [phone] Office: [phone]

## 2022-07-09 ENCOUNTER — Telehealth: Payer: Self-pay | Admitting: Cardiology

## 2022-07-09 NOTE — Telephone Encounter (Signed)
Oceanic-AF trial medication added to medication list.  Non-formulary entry marked for removal.

## 2022-07-29 DIAGNOSIS — E038 Other specified hypothyroidism: Secondary | ICD-10-CM | POA: Diagnosis not present

## 2022-07-29 DIAGNOSIS — E785 Hyperlipidemia, unspecified: Secondary | ICD-10-CM | POA: Diagnosis not present

## 2022-07-29 DIAGNOSIS — E559 Vitamin D deficiency, unspecified: Secondary | ICD-10-CM | POA: Diagnosis not present

## 2022-08-18 DIAGNOSIS — J0100 Acute maxillary sinusitis, unspecified: Secondary | ICD-10-CM | POA: Diagnosis not present

## 2022-08-18 DIAGNOSIS — R600 Localized edema: Secondary | ICD-10-CM | POA: Diagnosis not present

## 2022-08-21 ENCOUNTER — Other Ambulatory Visit: Payer: Self-pay | Admitting: Internal Medicine

## 2022-08-21 DIAGNOSIS — Z1231 Encounter for screening mammogram for malignant neoplasm of breast: Secondary | ICD-10-CM

## 2022-09-10 ENCOUNTER — Other Ambulatory Visit: Payer: Self-pay

## 2022-09-10 ENCOUNTER — Encounter: Payer: Self-pay | Admitting: Cardiology

## 2022-09-10 ENCOUNTER — Ambulatory Visit: Payer: PPO | Admitting: Cardiology

## 2022-09-10 ENCOUNTER — Ambulatory Visit: Payer: PPO

## 2022-09-10 VITALS — BP 197/99 | HR 79 | Resp 16 | Ht 67.0 in | Wt 169.0 lb

## 2022-09-10 DIAGNOSIS — I4819 Other persistent atrial fibrillation: Secondary | ICD-10-CM | POA: Diagnosis not present

## 2022-09-10 DIAGNOSIS — R600 Localized edema: Secondary | ICD-10-CM

## 2022-09-10 DIAGNOSIS — I361 Nonrheumatic tricuspid (valve) insufficiency: Secondary | ICD-10-CM | POA: Diagnosis not present

## 2022-09-10 DIAGNOSIS — I10 Essential (primary) hypertension: Secondary | ICD-10-CM

## 2022-09-10 DIAGNOSIS — I2729 Other secondary pulmonary hypertension: Secondary | ICD-10-CM | POA: Diagnosis not present

## 2022-09-10 MED ORDER — TORSEMIDE 20 MG PO TABS
20.0000 mg | ORAL_TABLET | Freq: Two times a day (BID) | ORAL | 3 refills | Status: DC
Start: 2022-09-10 — End: 2024-01-28

## 2022-09-10 NOTE — Progress Notes (Signed)
Patient referred by [name], MD for atrial fibrillation  Subjective:   [name], female    DOB: [birth year], 87 y.o.   MRN: [id number]   Chief Complaint  Patient presents with   Leg Swelling   Atrial Fibrillation    87 y.o. Caucasian female with hypertension, hyperlipidemia, persistent atrial fibrillation, moderate mitral regurgitation.  Patient has had worsening leg swelling for last few weeks. She was being seen at medication management office by Dr. Mateo Flow who requested patient to be seen. Patient denies any dyspnea. She walks and rides stationary bicycle for 3-5 miles without any difficulty. Blood pressure is elevated today, but she has had known white coat hypertension. Echocardiogram was performed prior to the visit, reviewed by me.    Current Outpatient Medications:    amLODipine (NORVASC) 5 MG tablet, Take 5 mg by mouth daily., Disp: , Rfl:    Cholecalciferol (VITAMIN D3) 25 MCG (1000 UT) CAPS, Take by mouth daily., Disp: , Rfl:    irbesartan (AVAPRO) 300 MG tablet, Take 300 mg by mouth daily., Disp: , Rfl:    metoprolol succinate (TOPROL-XL) 100 MG 24 hr tablet, TAKE 1 TABLET BY MOUTH ONCE A DAY. TAKE WITH OR IMMEDIATELY FOLLOWING A MEAL, Disp: 90 tablet, Rfl: 3   Multiple Vitamins-Minerals (MULTIVITAMIN ADULT PO), Take by mouth daily., Disp: , Rfl:    NON FORMULARY, Take by mouth daily. plexus, Disp: , Rfl:    Potassium 99 MG TABS, Take by mouth daily., Disp: , Rfl:    pravastatin (PRAVACHOL) 80 MG tablet, Take 80 mg by mouth daily. 1/2 tab daily, Disp: , Rfl:    rivaroxaban (XARELTO) 20 MG TABS tablet, Take 20 mg by mouth daily with supper., Disp: , Rfl:    sertraline (ZOLOFT) 25 MG tablet, Take 25 mg by mouth daily., Disp: , Rfl:    torsemide (DEMADEX) 20 MG tablet, Take 20 mg by mouth daily. 1/2 tab daily, Disp: , Rfl:    Turmeric 1053 MG TABS, Take by mouth., Disp: , Rfl:     Cardiovascular studies:  Echocardiogram 09/10/2022: Left ventricle cavity  is normal in size and wall thickness. Normal global wall motion. Normal LV systolic function with visual EF 55-60%. Unable to evaluate diastolic function due to atrial fibrillation. Left atrial cavity is severely dilated. Right atrial cavity is severely dilated. Trileaflet aortic valve with aortic sclerosis. No regurgitation. Mild calcification of the mitral valve annulus. Mild to moderate mitral regurgitation. Severe tricuspid regurgitation. Estimated pulmonary artery systolic pressure 75 mmHg.  Trace pericardial effusion. IVC is dilated with respiratory variation. Estimated right atrial pressure 15 mmHg. Compared to previous study in 2020, RA dilatation is new, LA dilatation has progressed from moderate, estimated PASP has increased from 46 mmHg.   EKG 10/08/2021: Atrial fibrillation  Diffuse low voltage Anteroseptal infarct -age undetermined  Echocardiogram 07/31/2019: Left ventricle cavity is normal in size. Moderate concentric hypertrophy of the left ventricle. Normal LV systolic function with EF 55%. Normal global wall motion. Unable to evaluate diastolic function due to atrial fibrillation.  Left atrial cavity is moderately dilated. Mild mitral valve leaflet calcification. Moderate (Grade III) mitral regurgitation. Moderate to severe tricuspid regurgitation. Moderate pulmonary hypertension. Estimated pulmonary artery systolic pressure is 46 mmHg.  IVC is dilated with a respiratory response of <50%. Estimated RA pressure 10-15 mmHg.  Recent labs: 01/27/2022: Glucose 95, BUN/Cr 28/1.04. EGFR 49. Na/K 140/4.4. Rest of the CMP normal Chol 139, TG 47, HDL 66, LDL 64  07/22/2021: Glucose 99, BUN/Cr 22/0.84.  EGFR 64. Na/K 142/4.1. Rest of the CMP normal H/H 12/37. MCV 97. Platelets 151 Chol 124, TG 64, HDL 55, LDL 56 TSH 5.3 high  07/18/2020: Glucose 100, BUN/Cr 20/0.96. EGFR 55. Na/K 143/5.1.  H/H 13/40. MCV 98. Platelets 143 Chol 133, TG 77, HDL 52, LDL 66 TSH 4.3 mildly  elevated  01/11/2020: Glucose 88, BUN/Cr 18/0.7. EGFR 70 HbA1C N/A% Chol 132, TG 74, HDL 57, LDL 73 TSH 3.4 normal  07/17/2019: Glucose 88. BUN/Cr 22/0.79. eGFR 70. Na/K 142/4.9. Rest of the CMP normal. H/H 12.8/39.2. MCV 96. Platelets 159. Chol 138, TG 75, HDL 55, LDL 68.    Review of Systems  Cardiovascular:  Positive for leg swelling. Negative for chest pain, dyspnea on exertion, palpitations and syncope.  Skin:        Bruising behind left knee        Vitals:   09/10/22 1353 09/10/22 1359  BP: (!) 198/97 (!) 197/99  Pulse: 70 79  Resp: 16   SpO2: 96%      Objective:   Physical Exam Vitals and nursing note reviewed.  Constitutional:      General: She is not in acute distress. Neck:     Vascular: No JVD.  Cardiovascular:     Rate and Rhythm: Normal rate. Rhythm irregular.     Pulses: Intact distal pulses.     Heart sounds: Normal heart sounds. No murmur heard. Pulmonary:     Effort: Pulmonary effort is normal.     Breath sounds: Normal breath sounds. No wheezing or rales.  Musculoskeletal:     Right lower leg: Edema (2+) present.     Left lower leg: Edema (2+) present.  Skin:    Findings: No bruising.         Assessment & Recommendations:   87 y.o. Caucasian female with hypertension, hyperlipidemia, persistent atrial fibrillation, moderate mitral regurgitation  Persistent atrial fibrillation: Rate controlled, likely longstanding.  CHA2DS2VASc score 4, annual; stroke risk 5%. She was enrolled in now discontinued OCE[employer] AF clinical trial. Now on Xarelto 20 mg daily. If GFR <50, reduce to 15 mg daily.  She now has developed biatrial fibrillation, severe TR and pulmonary hypertension, likely not reversible. Fortunately, her only symptoms right now are leg edema. Increase diuresis to torsemide 20 mg bid.   Moderate mitral regurgitation, mild PH: Secondary to Afib. Clinically stable, asymptomatic  Hypertension: She has historically had white coat  hypertension. Hopefully further diuresis will help.  Hyperlipidemia: Well-controlled.  F/u in 1 year   Nigel Mormon, MD Pager: [phone] Office: [phone]

## 2022-09-12 ENCOUNTER — Encounter: Payer: Self-pay | Admitting: Cardiology

## 2022-09-12 DIAGNOSIS — I2729 Other secondary pulmonary hypertension: Secondary | ICD-10-CM | POA: Insufficient documentation

## 2022-09-12 DIAGNOSIS — I361 Nonrheumatic tricuspid (valve) insufficiency: Secondary | ICD-10-CM | POA: Insufficient documentation

## 2022-10-08 ENCOUNTER — Other Ambulatory Visit: Payer: Self-pay | Admitting: Cardiology

## 2022-10-13 NOTE — Progress Notes (Unsigned)
Patient referred by Sylvia Pretty, MD for atrial fibrillation  Subjective:   [name], female    DOB: 1935/07/30, 88 y.o.   MRN: [id number]   No chief complaint on file.   88 y.o. Caucasian female with hypertension, hyperlipidemia, persistent atrial fibrillation, moderate mitral regurgitation.  Patient has had worsening leg swelling for last few weeks. She was being seen at medication management office by Dr. Mateo Flow who requested patient to be seen. Patient denies any dyspnea. She walks and rides stationary bicycle for 3-5 miles without any difficulty. Blood pressure is elevated today, but she has had known white coat hypertension. Echocardiogram was performed prior to the visit, reviewed by me.    Current Outpatient Medications:    amLODipine (NORVASC) 5 MG tablet, Take 5 mg by mouth daily., Disp: , Rfl:    Cholecalciferol (VITAMIN D3) 25 MCG (1000 UT) CAPS, Take by mouth daily., Disp: , Rfl:    irbesartan (AVAPRO) 300 MG tablet, Take 300 mg by mouth daily., Disp: , Rfl:    metoprolol succinate (TOPROL-XL) 100 MG 24 hr tablet, TAKE 1 TABLET BY MOUTH ONCE A DAY. TAKE WITH OR IMMEDIATELY FOLLOWING A MEAL, Disp: 90 tablet, Rfl: 3   Multiple Vitamins-Minerals (MULTIVITAMIN ADULT PO), Take by mouth daily., Disp: , Rfl:    NON FORMULARY, Take by mouth daily. plexus, Disp: , Rfl:    Potassium 99 MG TABS, Take by mouth daily., Disp: , Rfl:    pravastatin (PRAVACHOL) 80 MG tablet, Take 80 mg by mouth daily. 1/2 tab daily, Disp: , Rfl:    rivaroxaban (XARELTO) 20 MG TABS tablet, TAKE 1 TABLET BY MOUTH ONCE A DAY WITH SUPPER, Disp: 90 tablet, Rfl: 1   sertraline (ZOLOFT) 25 MG tablet, Take 25 mg by mouth daily., Disp: , Rfl:    torsemide (DEMADEX) 20 MG tablet, Take 1 tablet (20 mg total) by mouth 2 (two) times daily. 1/2 tab daily, Disp: 180 tablet, Rfl: 3   Turmeric 1053 MG TABS, Take by mouth., Disp: , Rfl:     Cardiovascular studies:  Echocardiogram 09/10/2022: Left ventricle  cavity is normal in size and wall thickness. Normal global wall motion. Normal LV systolic function with visual EF 55-60%. Unable to evaluate diastolic function due to atrial fibrillation. Left atrial cavity is severely dilated. Right atrial cavity is severely dilated. Trileaflet aortic valve with aortic sclerosis. No regurgitation. Mild calcification of the mitral valve annulus. Mild to moderate mitral regurgitation. Severe tricuspid regurgitation. Estimated pulmonary artery systolic pressure 75 mmHg.  Trace pericardial effusion. IVC is dilated with respiratory variation. Estimated right atrial pressure 15 mmHg. Compared to previous study in 2020, RA dilatation is new, LA dilatation has progressed from moderate, estimated PASP has increased from 46 mmHg.   EKG 10/08/2021: Atrial fibrillation  Diffuse low voltage Anteroseptal infarct -age undetermined  Echocardiogram 07/31/2019: Left ventricle cavity is normal in size. Moderate concentric hypertrophy of the left ventricle. Normal LV systolic function with EF 55%. Normal global wall motion. Unable to evaluate diastolic function due to atrial fibrillation.  Left atrial cavity is moderately dilated. Mild mitral valve leaflet calcification. Moderate (Grade III) mitral regurgitation. Moderate to severe tricuspid regurgitation. Moderate pulmonary hypertension. Estimated pulmonary artery systolic pressure is 46 mmHg.  IVC is dilated with a respiratory response of <50%. Estimated RA pressure 10-15 mmHg.  Recent labs: 01/27/2022: Glucose 95, BUN/Cr 28/1.04. EGFR 49. Na/K 140/4.4. Rest of the CMP normal Chol 139, TG 47, HDL 66, LDL 64  07/22/2021: Glucose 99, BUN/Cr 22/0.84.  EGFR 64. Na/K 142/4.1. Rest of the CMP normal H/H 12/37. MCV 97. Platelets 151 Chol 124, TG 64, HDL 55, LDL 56 TSH 5.3 high  07/18/2020: Glucose 100, BUN/Cr 20/0.96. EGFR 55. Na/K 143/5.1.  H/H 13/40. MCV 98. Platelets 143 Chol 133, TG 77, HDL 52, LDL 66 TSH 4.3 mildly  elevated  01/11/2020: Glucose 88, BUN/Cr 18/0.7. EGFR 70 HbA1C N/A% Chol 132, TG 74, HDL 57, LDL 73 TSH 3.4 normal  07/17/2019: Glucose 88. BUN/Cr 22/0.79. eGFR 70. Na/K 142/4.9. Rest of the CMP normal. H/H 12.8/39.2. MCV 96. Platelets 159. Chol 138, TG 75, HDL 55, LDL 68.    Review of Systems  Cardiovascular:  Positive for leg swelling. Negative for chest pain, dyspnea on exertion, palpitations and syncope.  Skin:        Bruising behind left knee        There were no vitals filed for this visit.    Objective:   Physical Exam Vitals and nursing note reviewed.  Constitutional:      General: She is not in acute distress. Neck:     Vascular: No JVD.  Cardiovascular:     Rate and Rhythm: Normal rate. Rhythm irregular.     Pulses: Intact distal pulses.     Heart sounds: Normal heart sounds. No murmur heard. Pulmonary:     Effort: Pulmonary effort is normal.     Breath sounds: Normal breath sounds. No wheezing or rales.  Musculoskeletal:     Right lower leg: Edema (2+) present.     Left lower leg: Edema (2+) present.  Skin:    Findings: No bruising.         Assessment & Recommendations:   88 y.o. Caucasian female with hypertension, hyperlipidemia, persistent atrial fibrillation, moderate mitral regurgitation  Persistent atrial fibrillation: Rate controlled, likely longstanding.  CHA2DS2VASc score 4, annual; stroke risk 5%. She was enrolled in now discontinued [employer] AF clinical trial. Now on Xarelto 20 mg daily. If GFR <50, reduce to 15 mg daily.  *** She now has developed biatrial fibrillation, severe TR and pulmonary hypertension, likely not reversible. Fortunately, her only symptoms right now are leg edema. Increase diuresis to torsemide 20 mg bid.   Moderate mitral regurgitation, mild PH: Secondary to Afib. Clinically stable, asymptomatic  Hypertension: She has historically had white coat hypertension. Hopefully further diuresis will  help.  Hyperlipidemia: Well-controlled.  F/u in 1 year   Nigel Mormon, MD Pager: [phone] Office: [phone]

## 2022-10-14 ENCOUNTER — Encounter: Payer: Self-pay | Admitting: Cardiology

## 2022-10-14 ENCOUNTER — Ambulatory Visit: Payer: PPO | Admitting: Cardiology

## 2022-10-14 VITALS — BP 134/77 | HR 75 | Resp 17 | Ht 67.0 in | Wt 174.0 lb

## 2022-10-14 DIAGNOSIS — I2729 Other secondary pulmonary hypertension: Secondary | ICD-10-CM

## 2022-10-14 DIAGNOSIS — I4819 Other persistent atrial fibrillation: Secondary | ICD-10-CM

## 2022-10-14 DIAGNOSIS — I10 Essential (primary) hypertension: Secondary | ICD-10-CM

## 2022-10-15 DIAGNOSIS — I4819 Other persistent atrial fibrillation: Secondary | ICD-10-CM | POA: Diagnosis not present

## 2022-10-16 ENCOUNTER — Ambulatory Visit: Payer: PPO

## 2022-10-16 LAB — BASIC METABOLIC PANEL
BUN/Creatinine Ratio: 19 (ref 12–28)
BUN: 21 mg/dL (ref 8–27)
CO2: 21 mmol/L (ref 20–29)
Calcium: 9.9 mg/dL (ref 8.7–10.3)
Chloride: 105 mmol/L (ref 96–106)
Creatinine, Ser: 1.08 mg/dL — ABNORMAL HIGH (ref 0.57–1.00)
Glucose: 143 mg/dL — ABNORMAL HIGH (ref 70–99)
Potassium: 3.9 mmol/L (ref 3.5–5.2)
Sodium: 144 mmol/L (ref 134–144)
eGFR: 50 mL/min/{1.73_m2} — ABNORMAL LOW (ref >59)

## 2022-10-17 MED ORDER — RIVAROXABAN 20 MG PO TABS: 20.0000 mg | ORAL_TABLET | Freq: Every day | ORAL | 2 refills | Status: DC

## 2022-10-17 NOTE — Addendum Note (Signed)
Addended by: Nigel Mormon on: 10/17/2022 07:44 PM   Modules accepted: Orders

## 2022-10-23 ENCOUNTER — Other Ambulatory Visit: Payer: Self-pay

## 2022-10-23 DIAGNOSIS — I4819 Other persistent atrial fibrillation: Secondary | ICD-10-CM

## 2022-11-16 ENCOUNTER — Other Ambulatory Visit: Payer: Self-pay | Admitting: Registered Nurse

## 2022-11-16 DIAGNOSIS — Z1231 Encounter for screening mammogram for malignant neoplasm of breast: Secondary | ICD-10-CM

## 2022-12-14 ENCOUNTER — Other Ambulatory Visit: Payer: Self-pay | Admitting: Cardiology

## 2022-12-24 ENCOUNTER — Other Ambulatory Visit: Payer: Self-pay

## 2022-12-24 DIAGNOSIS — E782 Mixed hyperlipidemia: Secondary | ICD-10-CM

## 2022-12-29 ENCOUNTER — Ambulatory Visit: Payer: PPO

## 2022-12-29 ENCOUNTER — Inpatient Hospital Stay: Admission: RE | Admit: 2022-12-29 | Payer: PPO | Source: Ambulatory Visit

## 2022-12-30 DIAGNOSIS — E782 Mixed hyperlipidemia: Secondary | ICD-10-CM | POA: Diagnosis not present

## 2022-12-31 LAB — BASIC METABOLIC PANEL
BUN/Creatinine Ratio: 23 (ref 12–28)
BUN: 25 mg/dL (ref 8–27)
CO2: 25 mmol/L (ref 20–29)
Calcium: 10.1 mg/dL (ref 8.7–10.3)
Chloride: 103 mmol/L (ref 96–106)
Creatinine, Ser: 1.09 mg/dL — ABNORMAL HIGH (ref 0.57–1.00)
Glucose: 100 mg/dL — ABNORMAL HIGH (ref 70–99)
Potassium: 3.4 mmol/L — ABNORMAL LOW (ref 3.5–5.2)
Sodium: 143 mmol/L (ref 134–144)
eGFR: 50 mL/min/{1.73_m2} — ABNORMAL LOW (ref >59)

## 2023-02-02 DIAGNOSIS — E038 Other specified hypothyroidism: Secondary | ICD-10-CM | POA: Diagnosis not present

## 2023-02-02 DIAGNOSIS — E785 Hyperlipidemia, unspecified: Secondary | ICD-10-CM | POA: Diagnosis not present

## 2023-02-02 DIAGNOSIS — E559 Vitamin D deficiency, unspecified: Secondary | ICD-10-CM | POA: Diagnosis not present

## 2023-02-02 DIAGNOSIS — R319 Hematuria, unspecified: Secondary | ICD-10-CM | POA: Diagnosis not present

## 2023-02-09 DIAGNOSIS — Z0000 Encounter for general adult medical examination without abnormal findings: Secondary | ICD-10-CM | POA: Diagnosis not present

## 2023-02-09 DIAGNOSIS — Z1212 Encounter for screening for malignant neoplasm of rectum: Secondary | ICD-10-CM | POA: Diagnosis not present

## 2023-02-09 DIAGNOSIS — E038 Other specified hypothyroidism: Secondary | ICD-10-CM | POA: Diagnosis not present

## 2023-02-09 DIAGNOSIS — R233 Spontaneous ecchymoses: Secondary | ICD-10-CM | POA: Diagnosis not present

## 2023-02-09 DIAGNOSIS — I10 Essential (primary) hypertension: Secondary | ICD-10-CM | POA: Diagnosis not present

## 2023-02-09 DIAGNOSIS — I4891 Unspecified atrial fibrillation: Secondary | ICD-10-CM | POA: Diagnosis not present

## 2023-02-09 DIAGNOSIS — D649 Anemia, unspecified: Secondary | ICD-10-CM | POA: Diagnosis not present

## 2023-02-09 DIAGNOSIS — E559 Vitamin D deficiency, unspecified: Secondary | ICD-10-CM | POA: Diagnosis not present

## 2023-02-09 DIAGNOSIS — E785 Hyperlipidemia, unspecified: Secondary | ICD-10-CM | POA: Diagnosis not present

## 2023-02-09 DIAGNOSIS — J069 Acute upper respiratory infection, unspecified: Secondary | ICD-10-CM | POA: Diagnosis not present

## 2023-02-09 DIAGNOSIS — Z7901 Long term (current) use of anticoagulants: Secondary | ICD-10-CM | POA: Diagnosis not present

## 2023-02-18 DIAGNOSIS — I4891 Unspecified atrial fibrillation: Secondary | ICD-10-CM | POA: Diagnosis not present

## 2023-02-18 DIAGNOSIS — D649 Anemia, unspecified: Secondary | ICD-10-CM | POA: Diagnosis not present

## 2023-02-18 DIAGNOSIS — Z7901 Long term (current) use of anticoagulants: Secondary | ICD-10-CM | POA: Diagnosis not present

## 2023-02-19 DIAGNOSIS — D649 Anemia, unspecified: Secondary | ICD-10-CM | POA: Diagnosis not present

## 2023-03-19 DIAGNOSIS — D62 Acute posthemorrhagic anemia: Secondary | ICD-10-CM | POA: Diagnosis not present

## 2023-03-19 DIAGNOSIS — I4891 Unspecified atrial fibrillation: Secondary | ICD-10-CM | POA: Diagnosis not present

## 2023-03-19 DIAGNOSIS — E038 Other specified hypothyroidism: Secondary | ICD-10-CM | POA: Diagnosis not present

## 2023-03-19 DIAGNOSIS — R195 Other fecal abnormalities: Secondary | ICD-10-CM | POA: Diagnosis not present

## 2023-03-22 DIAGNOSIS — L570 Actinic keratosis: Secondary | ICD-10-CM | POA: Diagnosis not present

## 2023-03-22 DIAGNOSIS — D225 Melanocytic nevi of trunk: Secondary | ICD-10-CM | POA: Diagnosis not present

## 2023-03-22 DIAGNOSIS — L814 Other melanin hyperpigmentation: Secondary | ICD-10-CM | POA: Diagnosis not present

## 2023-03-22 DIAGNOSIS — L821 Other seborrheic keratosis: Secondary | ICD-10-CM | POA: Diagnosis not present

## 2023-04-14 ENCOUNTER — Ambulatory Visit: Payer: PPO | Admitting: Cardiology

## 2023-04-19 ENCOUNTER — Ambulatory Visit: Payer: PPO | Admitting: Cardiology

## 2023-05-07 DIAGNOSIS — D649 Anemia, unspecified: Secondary | ICD-10-CM | POA: Diagnosis not present

## 2023-05-07 DIAGNOSIS — I4891 Unspecified atrial fibrillation: Secondary | ICD-10-CM | POA: Diagnosis not present

## 2023-05-07 DIAGNOSIS — L03115 Cellulitis of right lower limb: Secondary | ICD-10-CM | POA: Diagnosis not present

## 2023-05-11 ENCOUNTER — Encounter: Payer: Self-pay | Admitting: Cardiology

## 2023-05-13 ENCOUNTER — Ambulatory Visit: Payer: PPO | Admitting: Cardiology

## 2023-05-13 ENCOUNTER — Encounter: Payer: Self-pay | Admitting: Cardiology

## 2023-05-13 VITALS — BP 186/82 | HR 56 | Resp 16 | Ht 67.0 in | Wt 172.0 lb

## 2023-05-13 DIAGNOSIS — I4819 Other persistent atrial fibrillation: Secondary | ICD-10-CM | POA: Diagnosis not present

## 2023-05-13 DIAGNOSIS — K921 Melena: Secondary | ICD-10-CM

## 2023-05-13 DIAGNOSIS — I10 Essential (primary) hypertension: Secondary | ICD-10-CM | POA: Diagnosis not present

## 2023-05-13 NOTE — Progress Notes (Signed)
Patient referred by Merri Brunette, MD for atrial fibrillation  Subjective:   [name], female    DOB: 10/18/1934, 88 y.o.   MRN: [id number]   Chief Complaint  Patient presents with   Atrial Fibrillation   Follow-up    6 month    88 y.o. Caucasian female with hypertension, hyperlipidemia, persistent atrial fibrillation, moderate mitral regurgitation, h/o GI bleeding.   In May 2024, patient had an episode dark blood in stools. Xarelto was stopped then. She was referred to GI, but there were some issues with the appt, so has not seen them yet. She has been off Xarelto since then. She has not had any bleeding issues since then. Fortunately, she has not had any stroke symptoms.  Blood pressure elevated today, but is usually high in physicians office but much lower at home.     Current Outpatient Medications:    amLODipine (NORVASC) 5 MG tablet, Take 5 mg by mouth daily., Disp: , Rfl:    Cholecalciferol (VITAMIN D3) 25 MCG (1000 UT) CAPS, Take by mouth daily., Disp: , Rfl:    irbesartan (AVAPRO) 300 MG tablet, Take 300 mg by mouth daily., Disp: , Rfl:    metoprolol succinate (TOPROL-XL) 100 MG 24 hr tablet, TAKE ONE TABLET BY MOUTH ONCE A DAY. TAKE WITH OR IMMEDIATELY FOLLOWING A MEAL, Disp: 90 tablet, Rfl: 3   Multiple Vitamins-Minerals (MULTIVITAMIN ADULT PO), Take by mouth daily., Disp: , Rfl:    omeprazole (PRILOSEC) 40 MG capsule, Take 40 mg by mouth daily., Disp: , Rfl:    Potassium 99 MG TABS, Take by mouth daily., Disp: , Rfl:    pravastatin (PRAVACHOL) 80 MG tablet, Take 80 mg by mouth daily. 1/2 tab daily, Disp: , Rfl:    sertraline (ZOLOFT) 25 MG tablet, Take 25 mg by mouth daily., Disp: , Rfl:    torsemide (DEMADEX) 20 MG tablet, Take 1 tablet (20 mg total) by mouth 2 (two) times daily. 1/2 tab daily, Disp: 180 tablet, Rfl: 3   rivaroxaban (XARELTO) 20 MG TABS tablet, Take 1 tablet (20 mg total) by mouth daily with supper. (Patient not taking: Reported on 05/13/2023),  Disp: 90 tablet, Rfl: 2    Cardiovascular studies:  EKG 05/13/2023: Atrial fibrillation 65 bpm Diffuse low voltage Anteroseptal infarct -age undetermined  Echocardiogram 09/10/2022: Left ventricle cavity is normal in size and wall thickness. Normal global wall motion. Normal LV systolic function with visual EF 55-60%. Unable to evaluate diastolic function due to atrial fibrillation. Left atrial cavity is severely dilated. Right atrial cavity is severely dilated. Trileaflet aortic valve with aortic sclerosis. No regurgitation. Mild calcification of the mitral valve annulus. Mild to moderate mitral regurgitation. Severe tricuspid regurgitation. Estimated pulmonary artery systolic pressure 75 mmHg.  Trace pericardial effusion. IVC is dilated with respiratory variation. Estimated right atrial pressure 15 mmHg. Compared to previous study in 2020, RA dilatation is new, LA dilatation has progressed from moderate, estimated PASP has increased from 46 mmHg.   Recent labs: 05/07/2023: H/H 11/34. MCV 92. Platelets 188  12/30/2022: Glucose 100, BUN/Cr 25/1.09. EGFR 50. Na/K 143/3.4.    01/27/2022: Glucose 95, BUN/Cr 28/1.04. EGFR 49. Na/K 140/4.4. Rest of the CMP normal Chol 139, TG 47, HDL 66, LDL 64  07/22/2021: Glucose 99, BUN/Cr 22/0.84. EGFR 64. Na/K 142/4.1. Rest of the CMP normal H/H 12/37. MCV 97. Platelets 151 Chol 124, TG 64, HDL 55, LDL 56 TSH 5.3 high  07/18/2020: Glucose 100, BUN/Cr 20/0.96. EGFR 55. Na/K 143/5.1.  H/H  13/40. MCV 98. Platelets 143 Chol 133, TG 77, HDL 52, LDL 66 TSH 4.3 mildly elevated  01/11/2020: Glucose 88, BUN/Cr 18/0.7. EGFR 70 HbA1C N/A% Chol 132, TG 74, HDL 57, LDL 73 TSH 3.4 normal  07/17/2019: Glucose 88. BUN/Cr 22/0.79. eGFR 70. Na/K 142/4.9. Rest of the CMP normal. H/H 12.8/39.2. MCV 96. Platelets 159. Chol 138, TG 75, HDL 55, LDL 68.    Review of Systems  Cardiovascular:  Positive for leg swelling. Negative for chest pain, dyspnea on  exertion, palpitations and syncope.  Skin:        Bruising behind left knee        Vitals:   05/13/23 0956  BP: (!) 186/82  Pulse: (!) 56  Resp: 16  SpO2: 97%      Objective:   Physical Exam Vitals and nursing note reviewed.  Constitutional:      General: She is not in acute distress. Neck:     Vascular: No JVD.  Cardiovascular:     Rate and Rhythm: Normal rate. Rhythm irregular.     Pulses: Intact distal pulses.     Heart sounds: Normal heart sounds. No murmur heard. Pulmonary:     Effort: Pulmonary effort is normal.     Breath sounds: Normal breath sounds. No wheezing or rales.  Musculoskeletal:     Right lower leg: Edema (Trace) present.     Left lower leg: Edema (Trace) present.  Skin:    Findings: No bruising.    Visit diagnoses:   ICD-10-CM   1. Persistent atrial fibrillation (HCC)  I48.19 EKG 12-Lead    2. Essential hypertension  I10            Assessment & Recommendations:   88 y.o. Caucasian female with hypertension, hyperlipidemia, persistent atrial fibrillation, moderate mitral regurgitation  Persistent atrial fibrillation: Rate controlled, likely longstanding.  CHA2DS2VASc score 4, annual; stroke risk 5%. Not on Xarelto since May 2024 due to GI bleeding.  GI consultation is pending.   I discussed with the patient re: risks, benefits of anticoagulation vs not. Patient understands the stroke risk but is fearful of going on Xarelto again. I discussed with her re: referral to EP for [facility]. She remains unsure. She would like to discuss with her family. I will see her back again in 6-8 weeks to discuss this again.  Leg edema in the setting of pulmonary hypertension secondary to Afib seems to have resolved on torsemide, Continue the same.   Moderate mitral regurgitation, mild PH: Secondary to Afib. Clinically stable, asymptomatic  Hypertension: Component of hypertension. No symptoms related to uncontrolled hypertension. No changes made  today. Recommend regular monitoring at home.   Hyperlipidemia: Well-controlled.  F/u in 6-8 weeks    Elder Negus, MD Pager: [phone] Office: [phone]

## 2023-05-17 DIAGNOSIS — D649 Anemia, unspecified: Secondary | ICD-10-CM | POA: Diagnosis not present

## 2023-05-17 DIAGNOSIS — I4891 Unspecified atrial fibrillation: Secondary | ICD-10-CM | POA: Diagnosis not present

## 2023-05-17 DIAGNOSIS — L03115 Cellulitis of right lower limb: Secondary | ICD-10-CM | POA: Diagnosis not present

## 2023-05-21 ENCOUNTER — Ambulatory Visit: Payer: PPO | Admitting: Cardiology

## 2023-06-23 NOTE — Progress Notes (Unsigned)
  Cardiology Office Note:  .   Date:  06/23/2023  ID:  [name], DOB [birth year], MRN [id number] PCP: Merri Brunette, MD  [facility] Providers Cardiologist:  Truett Mainland, MD PCP: Merri Brunette, MD  No chief complaint on file.     History of Present Illness: .    [name] is a 88 y.o. female with hypertension, hyperlipidemia, persistent atrial fibrillation, h/o GI bleed-thus no on anticoagulation, moderate mitral regurgitation    There were no vitals filed for this visit.   ROS: *** ROS   Studies Reviewed: .       Echocardiogram 09/10/2022: Left ventricle cavity is normal in size and wall thickness. Normal global wall motion. Normal LV systolic function with visual EF 55-60%. Unable to evaluate diastolic function due to atrial fibrillation. Left atrial cavity is severely dilated. Right atrial cavity is severely dilated. Trileaflet aortic valve with aortic sclerosis. No regurgitation. Mild calcification of the mitral valve annulus. Mild to moderate mitral regurgitation. Severe tricuspid regurgitation. Estimated pulmonary artery systolic pressure 75 mmHg.  Trace pericardial effusion. IVC is dilated with respiratory variation. Estimated right atrial pressure 15 mmHg. Compared to previous study in 2020, RA dilatation is new, LA dilatation has progressed from moderate, estimated PASP has increased from 46 mmHg.   *** Risk Assessment/Calculations:   {Does this patient have ATRIAL FIBRILLATION?:[phone]}   Physical Exam:   Physical Exam   VISIT DIAGNOSES: No diagnosis found.   ASSESSMENT AND PLAN: .    [name] is a 88 y.o. female with hypertension, hyperlipidemia, persistent atrial fibrillation, h/o GI bleed-thus no on anticoagulation, moderate mitral regurgitation   *** Persistent atrial fibrillation: Rate controlled, likely longstanding.  CHA2DS2VASc score 4, annual; stroke risk 5%. Not on Xarelto since May 2024 due to GI  bleeding.  GI consultation is pending.    I discussed with the patient re: risks, benefits of anticoagulation vs not. Patient understands the stroke risk but is fearful of going on Xarelto again. ***I discussed with her re: referral to EP for [facility]. She remains unsure. She would like to discuss with her family. I will see her back again in 6-8 weeks to discuss this again.   Leg edema in the setting of pulmonary hypertension secondary to Afib seems to have resolved on torsemide, Continue the same.    Moderate mitral regurgitation, mild PH: Secondary to Afib. Clinically stable, asymptomatic   *** Hypertension: Component of hypertension. No symptoms related to uncontrolled hypertension. No changes made today. Recommend regular monitoring at home.    Hyperlipidemia: Well-controlled.  {Are you ordering a CV Procedure (e.g. stress test, cath, DCCV, TEE, etc)?   Press F2        :[id number]}    No orders of the defined types were placed in this encounter.    F/u in ***  Signed, Elder Negus, MD

## 2023-06-24 ENCOUNTER — Ambulatory Visit: Payer: PPO | Admitting: Cardiology

## 2023-06-24 ENCOUNTER — Encounter: Payer: Self-pay | Admitting: Cardiology

## 2023-06-24 ENCOUNTER — Ambulatory Visit: Payer: PPO | Attending: Cardiology | Admitting: Cardiology

## 2023-06-24 VITALS — BP 170/80 | HR 59 | Ht 68.0 in | Wt 167.0 lb

## 2023-06-24 DIAGNOSIS — I4819 Other persistent atrial fibrillation: Secondary | ICD-10-CM

## 2023-06-24 DIAGNOSIS — K921 Melena: Secondary | ICD-10-CM

## 2023-06-24 DIAGNOSIS — I10 Essential (primary) hypertension: Secondary | ICD-10-CM | POA: Diagnosis not present

## 2023-06-24 NOTE — Patient Instructions (Signed)
Medication Instructions:  Your physician recommends that you continue on your current medications as directed. Please refer to the Current Medication list given to you today.  *If you need a refill on your cardiac medications before your next appointment, please call your pharmacy*   Lab Work: None.  If you have labs (blood work) drawn today and your tests are completely normal, you will receive your results only by: MyChart Message (if you have MyChart) OR A paper copy in the mail If you have any lab test that is abnormal or we need to change your treatment, we will call you to review the results.   Testing/Procedures: None.   Follow-Up:  Your next appointment:   6 month(s)  Provider:   Dr. Mellody Dance   Other Instructions You have been referred to Dr. Lalla Brothers for evaluation for a Watchman Device. Someone will call you to set up an appointment.  You have also been referred to our hypertension clinic. You can potentially make an appointment for the same day as your appointment with Dr. Lalla Brothers.

## 2023-07-01 ENCOUNTER — Telehealth: Payer: Self-pay

## 2023-07-01 NOTE — Telephone Encounter (Signed)
Per Dr. Rosemary Holms, called to arrange Watchman consult.  Spoke with the patient at length. She stated she does not want any invasive procedures and declined Watchman consult.  Despite Xarelto in her med list, she reported she is not taking it and has not taken since May 2024.  She said she is willing to take a lower dose of Xarelto despite knowing a different dose may not be therapeutic. She requested Dr. Rosemary Holms to review and offer her another medication to try that is not as potent.  Will route to Dr. Rosemary Holms and Triage for follow-up.    Med list updated and referral closed.

## 2023-07-01 NOTE — Telephone Encounter (Signed)
I spoke with the patient and explained to her the risks and benefits of being on anticoagulation or undergoing Watchman procedure, versus not being on anticoagulation.  I explained to the patient that "half dose", or "less potent dose" of anticoagulation would not be ideal as it would not reduce stroke risk adequately, but still increase her bleeding risk.  Patient is fearful of any invasive procedure, as well as recurrent GI bleeding.  She has not seen GI yet.  She is going to see her PCP soon.  She is going to talk to her family this weekend and discuss the options and call us back.  I offered to speak with her family, but patient stated she will talk to them.  Elder Negus, MD

## 2023-08-03 DIAGNOSIS — E785 Hyperlipidemia, unspecified: Secondary | ICD-10-CM | POA: Diagnosis not present

## 2023-08-03 DIAGNOSIS — I10 Essential (primary) hypertension: Secondary | ICD-10-CM | POA: Diagnosis not present

## 2023-08-10 DIAGNOSIS — I10 Essential (primary) hypertension: Secondary | ICD-10-CM | POA: Diagnosis not present

## 2023-08-10 DIAGNOSIS — I4891 Unspecified atrial fibrillation: Secondary | ICD-10-CM | POA: Diagnosis not present

## 2023-08-10 DIAGNOSIS — Z23 Encounter for immunization: Secondary | ICD-10-CM | POA: Diagnosis not present

## 2023-08-10 DIAGNOSIS — E559 Vitamin D deficiency, unspecified: Secondary | ICD-10-CM | POA: Diagnosis not present

## 2023-08-10 DIAGNOSIS — E785 Hyperlipidemia, unspecified: Secondary | ICD-10-CM | POA: Diagnosis not present

## 2023-12-16 NOTE — Progress Notes (Deleted)
 Cardiology Office Note:  .   Date:  12/16/2023  ID:  AVIYAH SWETZ, DOB [birth year], MRN [id number] PCP: Merri Brunette, MD  [facility] Providers Cardiologist:  Truett Mainland, MD PCP: Merri Brunette, MD  No chief complaint on file.     History of Present Illness: .    TAREKA JHAVERI is a 89 y.o. female with hypertension, hyperlipidemia, persistent atrial fibrillation, h/o GI bleed-thus no on anticoagulation, moderate mitral regurgitation  *** Patient is doing well and does not have any complaints today.  She simply lost her granddaughter in law, age 48, and is recovering from the trauma.  She has not had any recurrent GI bleeding, but remains fearful of trying anticoagulation again.  She remains unsure about atrial fibrillation description of procedure, primarily because she does know about it.  Blood pressure remains elevated today, as it has been at previous office visits, but normally is lower nearly normal at home.    There were no vitals filed for this visit.    ROS:  Review of Systems  Cardiovascular:  Negative for chest pain, dyspnea on exertion, leg swelling, palpitations and syncope.     Studies Reviewed: .       Echocardiogram 09/10/2022: Left ventricle cavity is normal in size and wall thickness. Normal global wall motion. Normal LV systolic function with visual EF 55-60%. Unable to evaluate diastolic function due to atrial fibrillation. Left atrial cavity is severely dilated. Right atrial cavity is severely dilated. Trileaflet aortic valve with aortic sclerosis. No regurgitation. Mild calcification of the mitral valve annulus. Mild to moderate mitral regurgitation. Severe tricuspid regurgitation. Estimated pulmonary artery systolic pressure 75 mmHg.  Trace pericardial effusion. IVC is dilated with respiratory variation. Estimated right atrial pressure 15 mmHg. Compared to previous study in 2020, RA dilatation is new, LA dilatation has progressed  from moderate, estimated PASP has increased from 46 mmHg.   Risk Assessment/Calculations:   CHA2DS2-VASc Score = 4 [CHF History: 0, HTN History: 1, Diabetes History: 0, Stroke History: 0, Vascular Disease History: 0, Age Score: 2, Gender Score: 1].  Therefore, the patient's annual risk of stroke is 4.8 %.       Physical Exam:   Physical Exam Vitals and nursing note reviewed.  Constitutional:      General: She is not in acute distress. Neck:     Vascular: No JVD.  Cardiovascular:     Rate and Rhythm: Normal rate. Rhythm irregular.     Heart sounds: Normal heart sounds. No murmur heard. Pulmonary:     Effort: Pulmonary effort is normal.     Breath sounds: Normal breath sounds. No wheezing or rales.  Musculoskeletal:     Right lower leg: No edema.     Left lower leg: No edema.      VISIT DIAGNOSES: No diagnosis found.    ASSESSMENT AND PLAN: .    CASSANDRA HARBOLD is a 89 y.o. female with hypertension, hyperlipidemia, persistent atrial fibrillation, h/o GI bleed-thus no on anticoagulation, moderate mitral regurgitation   *** Persistent atrial fibrillation: Rate controlled, likely longstanding.  CHA2DS2VASc score 4, annual; stroke risk 5%. Not on Xarelto since May 2024 due to GI bleeding.  GI consultation is still pending.  Explained to the patient in the past that"half dose", or "less potent dose" of anticoagulation would not be ideal as it would not reduce stroke risk adequately, but still increase her bleeding risk.  Referral to EP for [facility] offered multiple times in the past, but patient  has been unsure.  ***  Moderate mitral regurgitation, mild PH: Secondary to Afib. Clinically stable, asymptomatic Leg edema in the setting of pulmonary hypertension secondary to Afib seems to have resolved on torsemide, Continue the same.    *** Hypertension: Suspect strong component of hypertension. No symptoms related to uncontrolled hypertension. No changes made today. Recommend  regular monitoring at home.  I will arrange follow-up with Pharm.D. clinic to further monitor disease, Thank you that she sees Dr. Lalla Brothers, as patient arrives from [facility] for her appointment.   *** Hyperlipidemia: Well-controlled.   F/u in *** months  Signed, Elder Negus, MD

## 2023-12-17 ENCOUNTER — Ambulatory Visit: Payer: PPO | Admitting: Cardiology

## 2023-12-17 DIAGNOSIS — E782 Mixed hyperlipidemia: Secondary | ICD-10-CM

## 2023-12-17 DIAGNOSIS — I4819 Other persistent atrial fibrillation: Secondary | ICD-10-CM

## 2023-12-17 DIAGNOSIS — I10 Essential (primary) hypertension: Secondary | ICD-10-CM

## 2024-01-24 ENCOUNTER — Ambulatory Visit: Admitting: Podiatry

## 2024-01-26 NOTE — Progress Notes (Addendum)
 Cardiology Office Note:  .   Date:  01/26/2024  ID:  [name], DOB 28-Jul-1935, MRN [id number] PCP: [name], MD  [facility] Providers Cardiologist:  Fransico Ivy, MD PCP: [name], MD  Chief Complaint  Patient presents with   Persistent atrial fibrillation    Follow-up    6 months      History of Present Illness: .    [name] is a 89 y.o. female with hypertension, hyperlipidemia, persistent atrial fibrillation, h/o GI bleed-thus not on anticoagulation, MR, TR, pulmonary hypertension  Patient is here today with her daughter-in-law.  Recently, she has had worsening leg swelling, as well as exertional dyspnea.  She is currently only taking torsemide  10 mg daily.  Dose was reduced in the past due to her fall.  She denies any chest pain.  She has not any recent GI bleeding.  She remains reluctant to retry anticoagulation, I will consider left atrial appendage closure.    Vitals:   01/28/24 0900  BP: (!) 146/60  Pulse: 69  Resp: 16  SpO2: 93%      ROS:  Review of Systems  Cardiovascular:  Positive for dyspnea on exertion and leg swelling. Negative for chest pain, palpitations and syncope.     Studies Reviewed: Aaron Aas        EKG 01/28/2024: Atrial fibrillation 67 bpm Low voltage QRS Septal infarct , age undetermined When compared with ECG of 05-Feb-2018 17:39, Atrial fibrillation has replaced Sinus rhythm Septal infarct is now Present Nonspecific T wave abnormality now evident in Anterior leads  Labs 12/2022: Chol 129, TG 72, HDL 56, LDL 58 Cr 1.0 TSH 4.9   Echocardiogram 09/10/2022: Left ventricle cavity is normal in size and wall thickness. Normal global wall motion. Normal LV systolic function with visual EF 55-60%. Unable to evaluate diastolic function due to atrial fibrillation. Left atrial cavity is severely dilated. Right atrial cavity is severely dilated. Trileaflet aortic valve with aortic sclerosis. No  regurgitation. Mild calcification of the mitral valve annulus. Mild to moderate mitral regurgitation. Severe tricuspid regurgitation. Estimated pulmonary artery systolic pressure 75 mmHg.  Trace pericardial effusion. IVC is dilated with respiratory variation. Estimated right atrial pressure 15 mmHg. Compared to previous study in 2020, RA dilatation is new, LA dilatation has progressed from moderate, estimated PASP has increased from 46 mmHg.    Risk Assessment/Calculations:   See below re: Afib CHF History: 0 HTN History: 1 Diabetes History: 0 Stroke History: 0 Vascular Disease History: 0 Age Score: 2 Gender Score: 1      Physical Exam:   Physical Exam Vitals and nursing note reviewed.  Constitutional:      General: She is not in acute distress. Neck:     Vascular: No JVD.  Cardiovascular:     Rate and Rhythm: Normal rate. Rhythm irregular.     Heart sounds: Normal heart sounds. No murmur heard. Pulmonary:     Effort: Pulmonary effort is normal.     Breath sounds: Normal breath sounds. No wheezing or rales.  Musculoskeletal:     Right lower leg: Edema (2+) present.     Left lower leg: Edema (2+) present.      VISIT DIAGNOSES:   ICD-10-CM   1. Persistent atrial fibrillation (HCC)  I48.19 EKG 12-Lead    Basic metabolic panel with GFR    Pro b natriuretic peptide (BNP)    2. Essential hypertension  I10     3. Leg edema  R60.0 Pro b natriuretic  peptide (BNP)    Basic metabolic panel with GFR    Pro b natriuretic peptide (BNP)    4. Other secondary pulmonary hypertension (HCC)  I27.29 Pro b natriuretic peptide (BNP)    Basic metabolic panel with GFR    ECHOCARDIOGRAM COMPLETE    5. Nonrheumatic tricuspid valve regurgitation  I36.1         ASSESSMENT AND PLAN: .    [name] is a 89 y.o. female with hypertension, hyperlipidemia, persistent atrial fibrillation, h/o GI bleed-thus not on anticoagulation, MR, TR, pulmonary hypertension  Leg edema,  exertional dyspnea: Likely consequence of longstanding A-fib leading to tricuspid regurgitation and pulmonary hypertension.  In fact, he was noted to have RVSP of 75 mmHg on echocardiogram in 08/2022.  Leg edema had improved with diuretics, but is worse now. Increase torsemide  from 10 mg twice daily to 40 mg in AM.  20 mg in p.m. Check BMP, proBNP today, and repeat in 2 weeks. Echocardiogram and same-day appointment-4 weeks. If no improvement in symptoms with outpatient mobility, she may need hospitalization for IV diuresis.  Persistent atrial fibrillation: Rate controlled. Not on Xarelto  since May 2024 due to GI bleeding.  No recurrence of GI bleeding. She understands risks of stroke, but is , but she remains reluctant to try anticoagulation, or consider left atrial Penders closure.    F/u in 3-4 weeks  Signed, Cody Das, MD

## 2024-01-28 ENCOUNTER — Encounter: Payer: Self-pay | Admitting: Cardiology

## 2024-01-28 ENCOUNTER — Ambulatory Visit: Attending: Cardiology | Admitting: Cardiology

## 2024-01-28 VITALS — BP 152/80 | HR 63 | Resp 16 | Ht 68.0 in | Wt 183.6 lb

## 2024-01-28 DIAGNOSIS — I2729 Other secondary pulmonary hypertension: Secondary | ICD-10-CM | POA: Diagnosis not present

## 2024-01-28 DIAGNOSIS — I10 Essential (primary) hypertension: Secondary | ICD-10-CM | POA: Diagnosis not present

## 2024-01-28 DIAGNOSIS — R600 Localized edema: Secondary | ICD-10-CM | POA: Diagnosis not present

## 2024-01-28 DIAGNOSIS — I4819 Other persistent atrial fibrillation: Secondary | ICD-10-CM | POA: Diagnosis not present

## 2024-01-28 DIAGNOSIS — I361 Nonrheumatic tricuspid (valve) insufficiency: Secondary | ICD-10-CM | POA: Diagnosis not present

## 2024-01-28 DIAGNOSIS — I272 Pulmonary hypertension, unspecified: Secondary | ICD-10-CM | POA: Diagnosis not present

## 2024-01-28 MED ORDER — TORSEMIDE 20 MG PO TABS: ORAL_TABLET | ORAL | 3 refills | Status: AC

## 2024-01-28 NOTE — Patient Instructions (Addendum)
 Medication Instructions:  CHANGE Torsemide  to 40 mg in the morning and 20 mg at night  *If you need a refill on your cardiac medications before your next appointment, please call your pharmacy*  Lab Work: proBNP BMP  BMP IN 2 WEEKS Probnp IN 2 WEEKS   If you have labs (blood work) drawn today and your tests are completely normal, you will receive your results only by: MyChart Message (if you have MyChart) OR A paper copy in the mail If you have any lab test that is abnormal or we need to change your treatment, we will call you to review the results.  Testing/Procedures: ECHO (IN 3 WEEKS)  Your physician has requested that you have an echocardiogram. Echocardiography is a painless test that uses sound waves to create images of your heart. It provides your doctor with information about the size and shape of your heart and how well your heart's chambers and valves are working. This procedure takes approximately one hour. There are no restrictions for this procedure. Please do NOT wear cologne, perfume, aftershave, or lotions (deodorant is allowed). Please arrive 15 minutes prior to your appointment time.  Please note: We ask at that you not bring children with you during ultrasound (echo/ vascular) testing. Due to room size and safety concerns, children are not allowed in the ultrasound rooms during exams. Our front office staff cannot provide observation of children in our lobby area while testing is being conducted. An adult accompanying a patient to their appointment will only be allowed in the ultrasound room at the discretion of the ultrasound technician under special circumstances. We apologize for any inconvenience.   Follow-Up: At [facility], you and your health needs are our priority.  As part of our continuing mission to provide you with exceptional heart care, our providers are all part of one team.  This team includes your primary Cardiologist (physician) and Advanced  Practice Providers or APPs (Physician Assistants and Nurse Practitioners) who all work together to provide you with the care you need, when you need it.  Your next appointment:   3 week  Provider:   Cody Das, MD

## 2024-01-29 ENCOUNTER — Encounter: Payer: Self-pay | Admitting: Cardiology

## 2024-01-29 LAB — BASIC METABOLIC PANEL WITH GFR
BUN/Creatinine Ratio: 18 (ref 12–28)
BUN: 21 mg/dL (ref 8–27)
CO2: 20 mmol/L (ref 20–29)
Calcium: 9.6 mg/dL (ref 8.7–10.3)
Chloride: 103 mmol/L (ref 96–106)
Creatinine, Ser: 1.15 mg/dL — ABNORMAL HIGH (ref 0.57–1.00)
Glucose: 115 mg/dL — ABNORMAL HIGH (ref 70–99)
Potassium: 3.8 mmol/L (ref 3.5–5.2)
Sodium: 142 mmol/L (ref 134–144)
eGFR: 46 mL/min/{1.73_m2} — ABNORMAL LOW (ref >59)

## 2024-01-29 LAB — PRO B NATRIURETIC PEPTIDE: NT-Pro BNP: 3706 pg/mL — ABNORMAL HIGH (ref 0–738)

## 2024-02-02 ENCOUNTER — Encounter: Payer: Self-pay | Admitting: Podiatry

## 2024-02-02 ENCOUNTER — Ambulatory Visit (INDEPENDENT_AMBULATORY_CARE_PROVIDER_SITE_OTHER)

## 2024-02-02 ENCOUNTER — Ambulatory Visit: Admitting: Podiatry

## 2024-02-02 DIAGNOSIS — M2012 Hallux valgus (acquired), left foot: Secondary | ICD-10-CM | POA: Diagnosis not present

## 2024-02-02 DIAGNOSIS — M2042 Other hammer toe(s) (acquired), left foot: Secondary | ICD-10-CM | POA: Diagnosis not present

## 2024-02-02 DIAGNOSIS — G5792 Unspecified mononeuropathy of left lower limb: Secondary | ICD-10-CM | POA: Diagnosis not present

## 2024-02-02 DIAGNOSIS — M2010 Hallux valgus (acquired), unspecified foot: Secondary | ICD-10-CM

## 2024-02-02 DIAGNOSIS — M2041 Other hammer toe(s) (acquired), right foot: Secondary | ICD-10-CM

## 2024-02-02 DIAGNOSIS — D2371 Other benign neoplasm of skin of right lower limb, including hip: Secondary | ICD-10-CM

## 2024-02-02 DIAGNOSIS — M2011 Hallux valgus (acquired), right foot: Secondary | ICD-10-CM

## 2024-02-02 NOTE — Progress Notes (Signed)
 She presents today chief complaint of a painful corn medial aspect fifth digit right foot with swollen left foot from where she fell several months ago.  Objective: Vital signs are stable alert and oriented x 3.  Pulses are palpable.  There is mild edema on the left foot none on the right from the adductovarus rotated hammertoe deformities bilateral but the fifth digit right does demonstrate a medial benign skin lesion with no ulcerative lesion visible.  Left foot does demonstrate pain on palpation around the 4th and 5th metatarsals in the intermetatarsal space particularly  Radiographs demonstrate osseously mature individual with moderate mineral loss.  Soft tissue swelling is visible left but no other significant osseous abnormalities in that lateral and dorsal lateral aspect of the foot.  Assessment soft tissue lesion fifth digit right.  Painful neuritis fourth intermetatarsal space left.  Edema left foot.  Plan: Debrided benign skin lesion injected the fourth intermetatarsal space with Kenalog  and local anesthetic.

## 2024-02-03 ENCOUNTER — Encounter: Payer: Self-pay | Admitting: Cardiology

## 2024-02-07 DIAGNOSIS — I10 Essential (primary) hypertension: Secondary | ICD-10-CM | POA: Diagnosis not present

## 2024-02-07 DIAGNOSIS — E038 Other specified hypothyroidism: Secondary | ICD-10-CM | POA: Diagnosis not present

## 2024-02-07 DIAGNOSIS — E559 Vitamin D deficiency, unspecified: Secondary | ICD-10-CM | POA: Diagnosis not present

## 2024-02-07 DIAGNOSIS — E785 Hyperlipidemia, unspecified: Secondary | ICD-10-CM | POA: Diagnosis not present

## 2024-02-07 DIAGNOSIS — Z0000 Encounter for general adult medical examination without abnormal findings: Secondary | ICD-10-CM | POA: Diagnosis not present

## 2024-02-08 ENCOUNTER — Inpatient Hospital Stay (HOSPITAL_COMMUNITY)
Admission: EM | Admit: 2024-02-08 | Discharge: 2024-02-11 | DRG: 377 | Disposition: A | Discharge status: Home Health | Attending: Internal Medicine | Admitting: Internal Medicine

## 2024-02-08 ENCOUNTER — Other Ambulatory Visit: Payer: Self-pay

## 2024-02-08 ENCOUNTER — Encounter (HOSPITAL_COMMUNITY): Payer: Self-pay

## 2024-02-08 DIAGNOSIS — K922 Gastrointestinal hemorrhage, unspecified: Secondary | ICD-10-CM | POA: Diagnosis not present

## 2024-02-08 DIAGNOSIS — Z823 Family history of stroke: Secondary | ICD-10-CM

## 2024-02-08 DIAGNOSIS — Z9049 Acquired absence of other specified parts of digestive tract: Secondary | ICD-10-CM | POA: Diagnosis not present

## 2024-02-08 DIAGNOSIS — D500 Iron deficiency anemia secondary to blood loss (chronic): Secondary | ICD-10-CM | POA: Diagnosis not present

## 2024-02-08 DIAGNOSIS — F419 Anxiety disorder, unspecified: Secondary | ICD-10-CM | POA: Diagnosis not present

## 2024-02-08 DIAGNOSIS — I4819 Other persistent atrial fibrillation: Secondary | ICD-10-CM | POA: Diagnosis not present

## 2024-02-08 DIAGNOSIS — Z85828 Personal history of other malignant neoplasm of skin: Secondary | ICD-10-CM | POA: Diagnosis not present

## 2024-02-08 DIAGNOSIS — Z91013 Allergy to seafood: Secondary | ICD-10-CM

## 2024-02-08 DIAGNOSIS — Z79899 Other long term (current) drug therapy: Secondary | ICD-10-CM | POA: Diagnosis not present

## 2024-02-08 DIAGNOSIS — R195 Other fecal abnormalities: Secondary | ICD-10-CM | POA: Diagnosis not present

## 2024-02-08 DIAGNOSIS — I130 Hypertensive heart and chronic kidney disease with heart failure and stage 1 through stage 4 chronic kidney disease, or unspecified chronic kidney disease: Secondary | ICD-10-CM | POA: Diagnosis present

## 2024-02-08 DIAGNOSIS — Z87891 Personal history of nicotine dependence: Secondary | ICD-10-CM | POA: Diagnosis not present

## 2024-02-08 DIAGNOSIS — K4090 Unilateral inguinal hernia, without obstruction or gangrene, not specified as recurrent: Secondary | ICD-10-CM | POA: Diagnosis not present

## 2024-02-08 DIAGNOSIS — D62 Acute posthemorrhagic anemia: Secondary | ICD-10-CM | POA: Diagnosis present

## 2024-02-08 DIAGNOSIS — I272 Pulmonary hypertension, unspecified: Secondary | ICD-10-CM | POA: Diagnosis not present

## 2024-02-08 DIAGNOSIS — I081 Rheumatic disorders of both mitral and tricuspid valves: Secondary | ICD-10-CM | POA: Diagnosis present

## 2024-02-08 DIAGNOSIS — R531 Weakness: Secondary | ICD-10-CM | POA: Diagnosis present

## 2024-02-08 DIAGNOSIS — K921 Melena: Secondary | ICD-10-CM | POA: Diagnosis not present

## 2024-02-08 DIAGNOSIS — Z803 Family history of malignant neoplasm of breast: Secondary | ICD-10-CM | POA: Diagnosis not present

## 2024-02-08 DIAGNOSIS — F39 Unspecified mood [affective] disorder: Secondary | ICD-10-CM | POA: Diagnosis not present

## 2024-02-08 DIAGNOSIS — K8689 Other specified diseases of pancreas: Secondary | ICD-10-CM | POA: Diagnosis not present

## 2024-02-08 DIAGNOSIS — R0600 Dyspnea, unspecified: Secondary | ICD-10-CM | POA: Diagnosis not present

## 2024-02-08 DIAGNOSIS — J90 Pleural effusion, not elsewhere classified: Secondary | ICD-10-CM | POA: Diagnosis not present

## 2024-02-08 DIAGNOSIS — I10 Essential (primary) hypertension: Secondary | ICD-10-CM | POA: Diagnosis not present

## 2024-02-08 DIAGNOSIS — E785 Hyperlipidemia, unspecified: Secondary | ICD-10-CM | POA: Diagnosis present

## 2024-02-08 DIAGNOSIS — R600 Localized edema: Secondary | ICD-10-CM | POA: Diagnosis not present

## 2024-02-08 DIAGNOSIS — D649 Anemia, unspecified: Secondary | ICD-10-CM | POA: Diagnosis not present

## 2024-02-08 DIAGNOSIS — D509 Iron deficiency anemia, unspecified: Secondary | ICD-10-CM | POA: Diagnosis not present

## 2024-02-08 DIAGNOSIS — Z91041 Radiographic dye allergy status: Secondary | ICD-10-CM

## 2024-02-08 DIAGNOSIS — N1831 Chronic kidney disease, stage 3a: Secondary | ICD-10-CM | POA: Diagnosis present

## 2024-02-08 DIAGNOSIS — Z820 Family history of epilepsy and other diseases of the nervous system: Secondary | ICD-10-CM

## 2024-02-08 DIAGNOSIS — K3189 Other diseases of stomach and duodenum: Secondary | ICD-10-CM | POA: Diagnosis not present

## 2024-02-08 DIAGNOSIS — I517 Cardiomegaly: Secondary | ICD-10-CM | POA: Diagnosis not present

## 2024-02-08 DIAGNOSIS — I700 Atherosclerosis of aorta: Secondary | ICD-10-CM | POA: Diagnosis not present

## 2024-02-08 DIAGNOSIS — K5730 Diverticulosis of large intestine without perforation or abscess without bleeding: Secondary | ICD-10-CM | POA: Diagnosis not present

## 2024-02-08 DIAGNOSIS — I5033 Acute on chronic diastolic (congestive) heart failure: Secondary | ICD-10-CM | POA: Diagnosis not present

## 2024-02-08 DIAGNOSIS — E876 Hypokalemia: Secondary | ICD-10-CM | POA: Diagnosis not present

## 2024-02-08 LAB — URINALYSIS, ROUTINE W REFLEX MICROSCOPIC
Bilirubin Urine: NEGATIVE
Glucose, UA: NEGATIVE mg/dL
Hgb urine dipstick: NEGATIVE
Ketones, ur: NEGATIVE mg/dL
Nitrite: NEGATIVE
Protein, ur: NEGATIVE mg/dL
Specific Gravity, Urine: 1.008 (ref 1.005–1.030)
pH: 6 (ref 5.0–8.0)

## 2024-02-08 LAB — COMPREHENSIVE METABOLIC PANEL WITH GFR
ALT: 13 U/L (ref 0–44)
AST: 20 U/L (ref 15–41)
Albumin: 3.5 g/dL (ref 3.5–5.0)
Alkaline Phosphatase: 44 U/L (ref 38–126)
Anion gap: 11 (ref 5–15)
BUN: 21 mg/dL (ref 8–23)
CO2: 27 mmol/L (ref 22–32)
Calcium: 9.7 mg/dL (ref 8.9–10.3)
Chloride: 102 mmol/L (ref 98–111)
Creatinine, Ser: 1.25 mg/dL — ABNORMAL HIGH (ref 0.44–1.00)
GFR, Estimated: 42 mL/min — ABNORMAL LOW (ref >60)
Glucose, Bld: 122 mg/dL — ABNORMAL HIGH (ref 70–99)
Potassium: 3 mmol/L — ABNORMAL LOW (ref 3.5–5.1)
Sodium: 140 mmol/L (ref 135–145)
Total Bilirubin: 2.1 mg/dL — ABNORMAL HIGH (ref 0.0–1.2)
Total Protein: 6 g/dL — ABNORMAL LOW (ref 6.5–8.1)

## 2024-02-08 LAB — CBC WITH DIFFERENTIAL/PLATELET
Abs Immature Granulocytes: 0.01 10*3/uL (ref 0.00–0.07)
Basophils Absolute: 0.1 10*3/uL (ref 0.0–0.1)
Basophils Relative: 2 %
Eosinophils Absolute: 0.2 10*3/uL (ref 0.0–0.5)
Eosinophils Relative: 5 %
HCT: 23 % — ABNORMAL LOW (ref 36.0–46.0)
Hemoglobin: 6.5 g/dL — CL (ref 12.0–15.0)
Immature Granulocytes: 0 %
Lymphocytes Relative: 19 %
Lymphs Abs: 0.7 10*3/uL (ref 0.7–4.0)
MCH: 21.6 pg — ABNORMAL LOW (ref 26.0–34.0)
MCHC: 28.3 g/dL — ABNORMAL LOW (ref 30.0–36.0)
MCV: 76.4 fL — ABNORMAL LOW (ref 80.0–100.0)
Monocytes Absolute: 0.5 10*3/uL (ref 0.1–1.0)
Monocytes Relative: 13 %
Neutro Abs: 2.3 10*3/uL (ref 1.7–7.7)
Neutrophils Relative %: 61 %
Platelets: 222 10*3/uL (ref 150–400)
RBC: 3.01 MIL/uL — ABNORMAL LOW (ref 3.87–5.11)
RDW: 18.5 % — ABNORMAL HIGH (ref 11.5–15.5)
WBC: 3.8 10*3/uL — ABNORMAL LOW (ref 4.0–10.5)
nRBC: 0.5 % — ABNORMAL HIGH (ref 0.0–0.2)

## 2024-02-08 LAB — POC OCCULT BLOOD, ED: Fecal Occult Bld: POSITIVE — AB

## 2024-02-08 LAB — PREPARE RBC (CROSSMATCH)

## 2024-02-08 LAB — BRAIN NATRIURETIC PEPTIDE: B Natriuretic Peptide: 521.5 pg/mL — ABNORMAL HIGH (ref 0.0–100.0)

## 2024-02-08 LAB — ABO/RH: ABO/RH(D): A POS

## 2024-02-08 MED ORDER — POTASSIUM CHLORIDE CRYS ER 20 MEQ PO TBCR
40.0000 meq | EXTENDED_RELEASE_TABLET | Freq: Once | ORAL | Status: AC
  Administered 2024-02-09: 40 meq via ORAL
  Filled 2024-02-08: qty 2

## 2024-02-08 MED ORDER — PANTOPRAZOLE SODIUM 40 MG IV SOLR
40.0000 mg | Freq: Two times a day (BID) | INTRAVENOUS | Status: DC
  Administered 2024-02-09 – 2024-02-11 (×5): 40 mg via INTRAVENOUS
  Filled 2024-02-08 (×5): qty 10

## 2024-02-08 MED ORDER — PANTOPRAZOLE SODIUM 40 MG IV SOLR: 40.0000 mg | INTRAVENOUS | Status: AC

## 2024-02-08 MED ORDER — SODIUM CHLORIDE 0.9% IV SOLUTION: Freq: Once | INTRAVENOUS | Status: AC

## 2024-02-08 NOTE — ED Triage Notes (Signed)
 Patient sent for blood transfusion from PCP. Labs drawn yesterday at [facility] showed hgb 6.7. Endorses dark stool x 2 days. Patient denies complaints.

## 2024-02-08 NOTE — ED Provider Triage Note (Signed)
 Emergency Medicine Provider Triage Evaluation Note  [name] , a 89 y.o. female  was evaluated in triage.  Pt complains of anemia, sob, generalized weakness.  Review of Systems  Positive: Pale conjunctiva, SOB, BLE Negative: Chest pain  Physical Exam  BP (!) 140/56 (BP Location: Left Arm)   Pulse 74   Temp 98 F (36.7 C)   Resp 16   SpO2 100%  Gen:   Awake, no distress   Resp:  Normal effort  MSK:   Moves extremities without difficulty  Other:    Medical Decision Making  Medically screening exam initiated at 7:04 PM.  Appropriate orders placed.  [name] was informed that the remainder of the evaluation will be completed by another provider, this initial triage assessment does not replace that evaluation, and the importance of remaining in the ED until their evaluation is complete.     Reathel Turi N, PA-C 02/08/24 1904

## 2024-02-08 NOTE — ED Provider Notes (Signed)
 [place] EMERGENCY DEPARTMENT AT [facility] Provider Note   CSN: [id number] Arrival date & time: 02/08/24  1834     History  Chief Complaint  Patient presents with   Abnormal Lab    [name] is a 89 y.o. female with past medical history significant for persistent A-fib but not currently on any anticoagulation who presents with concern for need for blood transfusion from PCP.  Labs drawn yesterday showed hemoglobin of 6.7, 6.5 today.  She endorses dark stools for 2 days.  No history of bleeding ulcers, alcohol use, ibuprofen use.  She does feel some generalized weakness.  She denies any abdominal pain.   Abnormal Lab      Home Medications Prior to Admission medications   Medication Sig Start Date End Date Taking? Authorizing Provider  amLODipine (NORVASC) 5 MG tablet Take 5 mg by mouth daily.    [provider]  Cholecalciferol (VITAMIN D3) 25 MCG (1000 UT) CAPS Take by mouth daily.    [provider]  irbesartan (AVAPRO) 300 MG tablet Take 300 mg by mouth daily.    [provider]  metoprolol  succinate (TOPROL -XL) 100 MG 24 hr tablet TAKE ONE TABLET BY MOUTH ONCE A DAY. TAKE WITH OR IMMEDIATELY FOLLOWING A MEAL 12/14/22   Patwardhan, Kaye Parsons, MD  Multiple Vitamins-Minerals (MULTIVITAMIN ADULT PO) Take by mouth daily.    [provider]  omeprazole (PRILOSEC) 40 MG capsule Take 40 mg by mouth daily.    [provider]  Potassium 99 MG TABS Take by mouth daily.    [provider]  pravastatin (PRAVACHOL) 80 MG tablet Take 80 mg by mouth daily. 1/2 tab daily    [provider]  sertraline (ZOLOFT) 25 MG tablet Take 25 mg by mouth daily. 02/03/22   [provider]  torsemide  (DEMADEX ) 20 MG tablet 40 mg in the morning and 20 mg at night 01/28/24   Patwardhan, Manish J, MD      Allergies    Shellfish allergy and Iodinated contrast media    Review of Systems   Review of Systems  All other  systems reviewed and are negative.   Physical Exam Updated Vital Signs BP (!) 140/56 (BP Location: Left Arm)   Pulse 74   Temp (!) 97.5 F (36.4 C) (Oral)   Resp 16   Ht 5\' 8"  (1.727 m)   Wt 83.3 kg   SpO2 100%   BMI 27.92 kg/m  Physical Exam Vitals and nursing note reviewed.  Constitutional:      General: She is not in acute distress.    Appearance: Normal appearance.  HENT:     Head: Normocephalic and atraumatic.  Eyes:     General:        Right eye: No discharge.        Left eye: No discharge.  Cardiovascular:     Rate and Rhythm: Normal rate and regular rhythm.     Heart sounds: No murmur heard.    No friction rub. No gallop.  Pulmonary:     Effort: Pulmonary effort is normal.     Breath sounds: Normal breath sounds.  Abdominal:     General: Bowel sounds are normal.     Palpations: Abdomen is soft.     Comments: No significant tenderness to palpation throughout abdomen  Genitourinary:    Comments: Obvious melanotic stool on rectal exam Skin:    General: Skin is warm and dry.     Capillary  Refill: Capillary refill takes less than 2 seconds.  Neurological:     Mental Status: She is alert and oriented to person, place, and time.  Psychiatric:        Mood and Affect: Mood normal.        Behavior: Behavior normal.     ED Results / Procedures / Treatments   Labs (all labs ordered are listed, but only abnormal results are displayed) Labs Reviewed  COMPREHENSIVE METABOLIC PANEL WITH GFR - Abnormal; Notable for the following components:      Result Value   Potassium 3.0 (*)    Glucose, Bld 122 (*)    Creatinine, Ser 1.25 (*)    Total Protein 6.0 (*)    Total Bilirubin 2.1 (*)    GFR, Estimated 42 (*)    All other components within normal limits  CBC WITH DIFFERENTIAL/PLATELET - Abnormal; Notable for the following components:   WBC 3.8 (*)    RBC 3.01 (*)    Hemoglobin 6.5 (*)    HCT 23.0 (*)    MCV 76.4 (*)    MCH 21.6 (*)    MCHC 28.3 (*)    RDW 18.5  (*)    nRBC 0.5 (*)    All other components within normal limits  URINALYSIS, ROUTINE W REFLEX MICROSCOPIC - Abnormal; Notable for the following components:   Leukocytes,Ua MODERATE (*)    Bacteria, UA RARE (*)    All other components within normal limits  BRAIN NATRIURETIC PEPTIDE - Abnormal; Notable for the following components:   B Natriuretic Peptide 521.5 (*)    All other components within normal limits  POC OCCULT BLOOD, ED - Abnormal; Notable for the following components:   Fecal Occult Bld POSITIVE (*)    All other components within normal limits  TYPE AND SCREEN  ABO/RH  PREPARE RBC (CROSSMATCH)    EKG None  Radiology No results found.  Procedures .Critical Care  Performed by: Nelly Banco, PA-C Authorized by: Nelly Banco, PA-C   Critical care provider statement:    Critical care time (minutes):  30   Critical care was time spent personally by me on the following activities:  Development of treatment plan with patient or surrogate, discussions with consultants, evaluation of patient's response to treatment, examination of patient, ordering and review of laboratory studies, ordering and review of radiographic studies, ordering and performing treatments and interventions, pulse oximetry, re-evaluation of patient's condition and review of old charts     Medications Ordered in ED Medications  0.9 %  sodium chloride infusion (Manually program via Guardrails IV Fluids) (has no administration in time range)  potassium chloride SA (KLOR-CON M) CR tablet 40 mEq (has no administration in time range)  pantoprazole (PROTONIX) injection 40 mg (has no administration in time range)    Followed by  pantoprazole (PROTONIX) injection 40 mg (has no administration in time range)    ED Course/ Medical Decision Making/ A&P                                 Medical Decision Making Amount and/or Complexity of Data Reviewed Labs: ordered.  Risk Prescription drug  management. Decision regarding hospitalization.   This patient is a 89 y.o. female who presents to the ED for concern of anemia, melanotic stool, this involves an extensive number of treatment options, and is a complaint that carries with it a high risk of complications and  morbidity. The emergent differential diagnosis prior to evaluation includes, but is not limited to,  acute gi bleed, vs other cause of anemia, chronic blood loss, iron deficiency anemia, hemodynamically concerning anemia . This is not an exhaustive differential.   Past Medical History / Co-morbidities / Social History: HLD, HTN, pulmonary hypertension  Physical Exam: Physical exam performed. The pertinent findings include: Mild hypertension, blood pressure 140/56 on arrival, vital signs otherwise stable, she has grossly melanotic stool on rectal exam, she has no significant tenderness to palpation of the abdomen.  Lab Tests: I ordered, and personally interpreted labs.  The pertinent results include: CBC with significant anemia, hemoglobin 6.5, no recent baseline but this does seem to be an acute change for the patient based on what her PCP had reported, she does have a mildly low MCV 76.4 which does suggest that she could have some chronic iron deficiency but nonetheless with her grossly melanotic stool, positive Hemoccult, elevated bilirubin likely from blood product breakdown I do suspect that this is an acute GI bleed.  Creatinine mildly elevated at 1.25, she does have hypokalemia, Tessman 3.0.  Her BNP is mildly elevated at 521.5.  UA does have moderate leukocytes but only rare bacteria, she is not having any urinary symptoms, suspect no urinary tract infection.   Imaging Studies: After speaking with Dr. Venice Gillis with gastroenterology he had initially recommended a CT angiogram of the abdomen and pelvis to assess for any source of GI bleeding in context of her low MCV, but as she has a contrast allergy we discussed whether this  need to be done emergently versus possibly during her hospitalization after premedication, and decided to hold off at this time.   Medications: I ordered medication including Protonix load and infusion, and hemoglobin for symptomatic anemia, GI bleed   Consultations Obtained: I requested consultation with the GI doctor, spoke with Dr. Venice Gillis who agrees with plan for Protonix, and hospitalist admit, I spoke with Dr. Del Favia who agrees to admission for symptomatic anemia secondary to acute GI bleed   Disposition: After consideration of the diagnostic results and the patients response to treatment, I feel that patient would benefit from admission as discussed above .   I discussed this case with my attending physician Dr. Nolia Baumgartner who cosigned this note including patient's presenting symptoms, physical exam, and planned diagnostics and interventions. Attending physician stated agreement with plan or made changes to plan which were implemented.    Final Clinical Impression(s) / ED Diagnoses Final diagnoses:  None    Rx / DC Orders ED Discharge Orders     None         Stefan Edge 02/08/24 2326    Hershel Los, MD 02/09/24 [phone]

## 2024-02-08 NOTE — ED Notes (Signed)
 Triage PA notified on low Hgb=6.5 result.

## 2024-02-08 NOTE — ED Notes (Signed)
 Called and placed PT on monitor with CCMD.

## 2024-02-09 ENCOUNTER — Inpatient Hospital Stay (HOSPITAL_COMMUNITY)

## 2024-02-09 DIAGNOSIS — D509 Iron deficiency anemia, unspecified: Secondary | ICD-10-CM

## 2024-02-09 DIAGNOSIS — R600 Localized edema: Secondary | ICD-10-CM | POA: Diagnosis not present

## 2024-02-09 DIAGNOSIS — K921 Melena: Principal | ICD-10-CM

## 2024-02-09 DIAGNOSIS — D649 Anemia, unspecified: Secondary | ICD-10-CM | POA: Diagnosis not present

## 2024-02-09 LAB — ECHOCARDIOGRAM COMPLETE
AR max vel: 1.11 cm2
AV Area VTI: 1.15 cm2
AV Area mean vel: 1.14 cm2
AV Mean grad: 9 mmHg
AV Peak grad: 17.1 mmHg
Ao pk vel: 2.07 m/s
Height: 68 in
S' Lateral: 2.2 cm
Weight: 2938.29 [oz_av]

## 2024-02-09 LAB — HEMOGLOBIN AND HEMATOCRIT, BLOOD
HCT: 25.9 % — ABNORMAL LOW (ref 36.0–46.0)
HCT: 26.4 % — ABNORMAL LOW (ref 36.0–46.0)
HCT: 26.5 % — ABNORMAL LOW (ref 36.0–46.0)
Hemoglobin: 7.5 g/dL — ABNORMAL LOW (ref 12.0–15.0)
Hemoglobin: 7.7 g/dL — ABNORMAL LOW (ref 12.0–15.0)
Hemoglobin: 7.6 g/dL — ABNORMAL LOW (ref 12.0–15.0)

## 2024-02-09 LAB — COMPREHENSIVE METABOLIC PANEL WITH GFR
ALT: 13 U/L (ref 0–44)
AST: 23 U/L (ref 15–41)
Albumin: 3.5 g/dL (ref 3.5–5.0)
Alkaline Phosphatase: 43 U/L (ref 38–126)
Anion gap: 10 (ref 5–15)
BUN: 20 mg/dL (ref 8–23)
CO2: 25 mmol/L (ref 22–32)
Calcium: 9.3 mg/dL (ref 8.9–10.3)
Chloride: 103 mmol/L (ref 98–111)
Creatinine, Ser: 1.21 mg/dL — ABNORMAL HIGH (ref 0.44–1.00)
GFR, Estimated: 44 mL/min — ABNORMAL LOW (ref >60)
Glucose, Bld: 122 mg/dL — ABNORMAL HIGH (ref 70–99)
Potassium: 2.9 mmol/L — ABNORMAL LOW (ref 3.5–5.1)
Sodium: 138 mmol/L (ref 135–145)
Total Bilirubin: 2.2 mg/dL — ABNORMAL HIGH (ref 0.0–1.2)
Total Protein: 6 g/dL — ABNORMAL LOW (ref 6.5–8.1)

## 2024-02-09 LAB — CBC
HCT: 25.5 % — ABNORMAL LOW (ref 36.0–46.0)
Hemoglobin: 7.6 g/dL — ABNORMAL LOW (ref 12.0–15.0)
MCH: 21.2 pg — ABNORMAL LOW (ref 26.0–34.0)
MCHC: 29.8 g/dL — ABNORMAL LOW (ref 30.0–36.0)
MCV: 71.2 fL — ABNORMAL LOW (ref 80.0–100.0)
Platelets: 192 10*3/uL (ref 150–400)
RBC: 3.58 MIL/uL — ABNORMAL LOW (ref 3.87–5.11)
RDW: 20.6 % — ABNORMAL HIGH (ref 11.5–15.5)
WBC: 3.2 10*3/uL — ABNORMAL LOW (ref 4.0–10.5)
nRBC: 0 % (ref 0.0–0.2)

## 2024-02-09 LAB — FERRITIN: Ferritin: 9 ng/mL — ABNORMAL LOW (ref 11–307)

## 2024-02-09 LAB — IRON AND TIBC
Iron: 27 ug/dL — ABNORMAL LOW (ref 28–170)
Saturation Ratios: 5 % — ABNORMAL LOW (ref 10.4–31.8)
TIBC: 528 ug/dL — ABNORMAL HIGH (ref 250–450)
UIBC: 501 ug/dL

## 2024-02-09 LAB — RETICULOCYTES
Immature Retic Fract: 27.8 % — ABNORMAL HIGH (ref 2.3–15.9)
RBC.: 3.57 MIL/uL — ABNORMAL LOW (ref 3.87–5.11)
Retic Count, Absolute: 47.8 10*3/uL (ref 19.0–186.0)
Retic Ct Pct: 1.3 % (ref 0.4–3.1)

## 2024-02-09 LAB — MAGNESIUM: Magnesium: 1.7 mg/dL (ref 1.7–2.4)

## 2024-02-09 LAB — PHOSPHORUS: Phosphorus: 3.1 mg/dL (ref 2.5–4.6)

## 2024-02-09 MED ORDER — MELATONIN 5 MG PO TABS: 5.0000 mg | ORAL_TABLET | Freq: Every evening | ORAL | Status: DC | PRN

## 2024-02-09 MED ORDER — PRAVASTATIN SODIUM 40 MG PO TABS
40.0000 mg | ORAL_TABLET | Freq: Every day | ORAL | Status: DC
  Administered 2024-02-10 – 2024-02-11 (×2): 40 mg via ORAL
  Filled 2024-02-09 (×3): qty 1

## 2024-02-09 MED ORDER — AMLODIPINE BESYLATE 5 MG PO TABS
5.0000 mg | ORAL_TABLET | Freq: Every day | ORAL | Status: DC
  Administered 2024-02-10: 5 mg via ORAL
  Filled 2024-02-09: qty 1

## 2024-02-09 MED ORDER — BOOST / RESOURCE BREEZE PO LIQD CUSTOM: 1.0000 | Freq: Three times a day (TID) | ORAL | Status: DC

## 2024-02-09 MED ORDER — POTASSIUM CHLORIDE CRYS ER 20 MEQ PO TBCR
40.0000 meq | EXTENDED_RELEASE_TABLET | Freq: Two times a day (BID) | ORAL | Status: AC
  Administered 2024-02-09 (×2): 40 meq via ORAL
  Filled 2024-02-09 (×2): qty 2

## 2024-02-09 MED ORDER — PROCHLORPERAZINE EDISYLATE 10 MG/2ML IJ SOLN: 5.0000 mg | Freq: Four times a day (QID) | INTRAMUSCULAR | Status: DC | PRN

## 2024-02-09 MED ORDER — POLYETHYLENE GLYCOL 3350 17 G PO PACK: 17.0000 g | PACK | Freq: Every day | ORAL | Status: DC | PRN

## 2024-02-09 MED ORDER — FUROSEMIDE 10 MG/ML IJ SOLN
40.0000 mg | Freq: Every day | INTRAMUSCULAR | Status: DC
  Filled 2024-02-09: qty 4

## 2024-02-09 MED ORDER — METOPROLOL SUCCINATE ER 50 MG PO TB24
50.0000 mg | ORAL_TABLET | Freq: Every day | ORAL | Status: DC
  Administered 2024-02-09 – 2024-02-10 (×2): 50 mg via ORAL
  Filled 2024-02-09: qty 2
  Filled 2024-02-09: qty 1

## 2024-02-09 MED ORDER — IRON SUCROSE 200 MG IVPB - SIMPLE MED
200.0000 mg | Freq: Once | Status: AC
  Administered 2024-02-09: 200 mg via INTRAVENOUS
  Filled 2024-02-09: qty 110

## 2024-02-09 MED ORDER — ACETAMINOPHEN 325 MG PO TABS: 650.0000 mg | ORAL_TABLET | Freq: Four times a day (QID) | ORAL | Status: DC | PRN

## 2024-02-09 MED ORDER — SERTRALINE HCL 25 MG PO TABS
25.0000 mg | ORAL_TABLET | Freq: Every day | ORAL | Status: DC
  Administered 2024-02-09 – 2024-02-10 (×2): 25 mg via ORAL
  Filled 2024-02-09 (×2): qty 1

## 2024-02-09 NOTE — H&P (View-Only) (Signed)
 Consultation  Referring Provider: [facility]/ [facility]be Primary Care Physician:  [name], MD Primary Gastroenterologist:  Janie Meier remote DR Alethia Huxley  Reason for Consultation:  melena, anemia  HPI: [name] is a 89 y.o. female with history of hypertension, hyperlipidemia, previous cholecystectomy and hysterectomy with history of chronic atrial fibrillation.  She and her son states she has not been on any blood thinners or aspirin.  She had been on a blood thinner in the past and it sounds as if within the past 2 years she had developed heme positive stool and anemia and patient decided to stop the blood thinner.  She was also on aspirin for period of time which was also stopped she says she started taking a multivitamin with iron and was told that her anemia improved by her PCP at [facility]. She is not on any regular NSAIDs.  She says that she has not been feeling well over the past couple of months has noticed onset of fatigue and some shortness of breath with exertion.  She also developed some lower extremity edema within the past couple of months and had been put on Lasix 60 mg p.o. daily. Appetite has been decreased, no complaints of heartburn or indigestion no dysphagia or odynophagia.  She has had some occasional episodes of nausea with vomiting, no heme.  She says she has been feeling uncomfortable or queasy sometimes with an empty stomach.  She says her bowel movements have been normal usually 1 to 2/day.  She had not noticed any melena or hematochezia until over the past 3 to 4 days when she noticed that her stool became black.  She was seen outpatient by primary care yesterday found to have hemoglobin of 6.7 and referred to the emergency room.  Noted to have overt melena here per ER MD, hemodynamically stable. She reports very remote colonoscopy and EGD per Dr. Alethia Huxley probably 20 years ago and was never told that she had any problems.  Labs here on arrival WBC  3.8/hemoglobin 6.5/hematocrit 23/MCV 76/platelets 222 BNP 521 Sodium 140/potassium 3.0/BUN 21/creatinine 1.25 LFTs within normal limits/T. bili 2.1 UA positive  Ferritin 9/serum iron 27/TIBC 528/iron sat 5 Potassium was 2.9 earlier this a.m.  She has since had 4 runs of potassium. Hemoglobin today 7.6 after 1 unit of packed RBCs.  Says she feels a little better, last stool earlier this morning.   Past Medical History:  Diagnosis Date   Cancer (HCC)    skin   Hyperlipidemia    Hypertension     Past Surgical History:  Procedure Laterality Date   ABDOMINAL HYSTERECTOMY     CHOLECYSTECTOMY     GALLBLADDER SURGERY      Prior to Admission medications   Medication Sig Start Date End Date Taking? Authorizing Provider  amLODipine (NORVASC) 5 MG tablet Take 5 mg by mouth daily at 12 noon.   Yes [provider]  Cholecalciferol (VITAMIN D3) 25 MCG (1000 UT) CAPS Take 1,000 Units by mouth daily.   Yes [provider]  cyanocobalamin 1000 MCG tablet Take 1,000 mcg by mouth daily.   Yes [provider]  irbesartan (AVAPRO) 300 MG tablet Take 300 mg by mouth daily.   Yes [provider]  metoprolol  succinate (TOPROL -XL) 100 MG 24 hr tablet TAKE ONE TABLET BY MOUTH ONCE A DAY. TAKE WITH OR IMMEDIATELY FOLLOWING A MEAL Patient taking differently: Take 100 mg by mouth daily with supper. Please take this medication between 1700-1800 12/14/22  Yes  Patwardhan, Manish J, MD  Multiple Vitamins-Minerals (MULTIVITAMIN ADULT PO) Take by mouth daily.   Yes [provider]  omeprazole (PRILOSEC) 40 MG capsule Take 40 mg by mouth daily.   Yes [provider]  OVER THE COUNTER MEDICATION Apply 1 application  topically as needed (Bil. knee pain; joint pain). RAWLEIGH'S SALVE  Apply to knees/joints daily after shower.   Yes [provider]  Potassium 99 MG TABS Take by mouth daily.   Yes [provider]  pravastatin (PRAVACHOL) 80 MG  tablet Take 40 mg by mouth daily. Take one-half tablet by mouth daily.   Yes [provider]  sertraline (ZOLOFT) 25 MG tablet Take 25 mg by mouth at bedtime. 02/03/22  Yes [provider]  torsemide  (DEMADEX ) 20 MG tablet 40 mg in the morning and 20 mg at night Patient taking differently: Take 20-40 mg by mouth 2 (two) times daily. Take two tablets (40mg ) by mouth in the morning. Take one tablet (20mg ) by mouth in the afternoon around lunch time. 01/28/24  Yes Patwardhan, Kaye Parsons, MD    Current Facility-Administered Medications  Medication Dose Route Frequency Provider Last Rate Last Admin   acetaminophen  (TYLENOL ) tablet 650 mg  650 mg Oral Q6H PRN Hall, Carole N, DO       melatonin tablet 5 mg  5 mg Oral QHS PRN Reesa Cannon N, DO       pantoprazole (PROTONIX) injection 40 mg  40 mg Intravenous Q12H Prosperi, Christian H, PA-C       polyethylene glycol (MIRALAX / GLYCOLAX) packet 17 g  17 g Oral Daily PRN Hall, Carole N, DO       potassium chloride SA (KLOR-CON M) CR tablet 40 mEq  40 mEq Oral BID Reesa Cannon N, DO   40 mEq at 02/09/24 4098   prochlorperazine (COMPAZINE) injection 5 mg  5 mg Intravenous Q6H PRN Bary Boss, DO       Current Outpatient Medications  Medication Sig Dispense Refill   amLODipine (NORVASC) 5 MG tablet Take 5 mg by mouth daily at 12 noon.     Cholecalciferol (VITAMIN D3) 25 MCG (1000 UT) CAPS Take 1,000 Units by mouth daily.     cyanocobalamin 1000 MCG tablet Take 1,000 mcg by mouth daily.     irbesartan (AVAPRO) 300 MG tablet Take 300 mg by mouth daily.     metoprolol  succinate (TOPROL -XL) 100 MG 24 hr tablet TAKE ONE TABLET BY MOUTH ONCE A DAY. TAKE WITH OR IMMEDIATELY FOLLOWING A MEAL (Patient taking differently: Take 100 mg by mouth daily with supper. Please take this medication between 1700-1800) 90 tablet 3   Multiple Vitamins-Minerals (MULTIVITAMIN ADULT PO) Take by mouth daily.     omeprazole (PRILOSEC) 40 MG capsule Take 40 mg by mouth  daily.     OVER THE COUNTER MEDICATION Apply 1 application  topically as needed (Bil. knee pain; joint pain). RAWLEIGH'S SALVE  Apply to knees/joints daily after shower.     Potassium 99 MG TABS Take by mouth daily.     pravastatin (PRAVACHOL) 80 MG tablet Take 40 mg by mouth daily. Take one-half tablet by mouth daily.     sertraline (ZOLOFT) 25 MG tablet Take 25 mg by mouth at bedtime.     torsemide  (DEMADEX ) 20 MG tablet 40 mg in the morning and 20 mg at night (Patient taking differently: Take 20-40 mg by mouth 2 (two) times daily. Take two tablets (40mg ) by mouth in the morning. Take  one tablet (20mg ) by mouth in the afternoon around lunch time.) 270 tablet 3    Allergies as of 02/08/2024 - Review Complete 02/08/2024  Allergen Reaction Noted   Shellfish allergy Anaphylaxis and Rash 02/05/2018   Iodinated contrast media Rash 10/08/2021    Family History  Adopted: Yes  Problem Relation Age of Onset   Breast cancer Son        unsure of age   Stroke Mother    Aneurysm Brother    Parkinson's disease Brother     Social History   Socioeconomic History   Marital status: Married    Spouse name: Not on file   Number of children: 2   Years of education: Not on file   Highest education level: Not on file  Occupational History   Not on file  Tobacco Use   Smoking status: Former    Current packs/day: 0.00    Average packs/day: 0.3 packs/day for 5.0 years (1.3 ttl pk-yrs)    Types: Cigarettes    Start date: 107    Quit date: 1964    Years since quitting: 61.4   Smokeless tobacco: Never  Vaping Use   Vaping status: Never Used  Substance and Sexual Activity   Alcohol use: Not Currently   Drug use: Never   Sexual activity: Not on file  Other Topics Concern   Not on file  Social History Narrative   Not on file   Social Drivers of Health   Financial Resource Strain: Not on file  Food Insecurity: Not on file  Transportation Needs: Not on file  Physical Activity: Not on  file  Stress: Not on file  Social Connections: Not on file  Intimate Partner Violence: Not on file    Review of Systems: Pertinent positive and negative review of systems were noted in the above HPI section.  All other review of systems was otherwise negative.  Physical Exam: Vital signs in last 24 hours: Temp:  [97.3 F (36.3 C)-98.1 F (36.7 C)] 98.1 F (36.7 C) (05/21 0726) Pulse Rate:  [67-81] 81 (05/21 0715) Resp:  [15-25] 25 (05/21 0715) BP: (118-156)/(56-93) 155/83 (05/21 0715) SpO2:  [93 %-100 %] 97 % (05/21 0715) Weight:  [83.3 kg] 83.3 kg (05/20 2201)   General:   Alert,  Well-developed, well-nourished, elderly white female pleasant and cooperative in NAD-son at bedside Head:  Normocephalic and atraumatic. Eyes:  Sclera clear, no icterus.   Conjunctiva pale Ears:  Normal auditory acuity. Nose:  No deformity, discharge,  or lesions. Mouth:  No deformity or lesions.   Neck:  Supple; no masses or thyromegaly. Lungs:  Clear throughout to auscultation.   No wheezes, crackles, or rhonchi.  Heart:  Regular rate and rhythm; no murmurs, clicks, rubs,  or gallops. Abdomen:  Soft,nontender, BS active,nonpalp mass or hsm.   Rectal: Done, melena documented per ER Msk:  Symmetrical without gross deformities. . Pulses:  Normal pulses noted. Extremities:  Without clubbing or edema. Neurologic:  Alert and  oriented x4;  grossly normal neurologically. Skin:  Intact without significant lesions or rashes.. Psych:  Alert and cooperative. Normal mood and affect.  Intake/Output from previous day: 05/20 0701 - 05/21 0700 In: 349 [I.V.:34; Blood:315] Out: -  Intake/Output this shift: No intake/output data recorded.  Lab Results: Recent Labs    02/08/24 1920 02/09/24 0609  WBC 3.8* 3.2*  HGB 6.5* 7.6*  HCT 23.0* 25.5*  PLT 222 192   BMET Recent Labs    02/08/24 1920 02/09/24  0609  NA 140 138  K 3.0* 2.9*  CL 102 103  CO2 27 25  GLUCOSE 122* 122*  BUN 21 20   CREATININE 1.25* 1.21*  CALCIUM 9.7 9.3   LFT Recent Labs    02/09/24 0609  PROT 6.0*  ALBUMIN 3.5  AST 23  ALT 13  ALKPHOS 43  BILITOT 2.2*   PT/INR No results for input(s): "LABPROT", "INR" in the last 72 hours. Hepatitis Panel No results for input(s): "HEPBSAG", "HCVAB", "HEPAIGM", "HEPBIGM" in the last 72 hours.    IMPRESSION:  #42 89 year old white female presenting with 3 to 4-day history of melena and 72-month history of fatigue and exertional dyspnea. No blood thinners, no aspirin or NSAIDs. She has been having some intermittent queasiness more notable with empty stomach and has had a couple of episodes of nausea and vomiting over the past couple of months, decreased appetite.  Had lost about 6 pounds but also had been placed on Lasix.  She reports being told that she was anemic and had heme positive stool within the past 2 years at which time she had been on a blood thinner for atrial fibrillation.  Ultimately she decided to stop all anticoagulation including aspirin, took a over-the-counter vitamin with iron and reports that her anemia improved and no further workup was pursued.  Patient presents now with profound anemia hemoglobin of 6.5 and significant iron deficiency as well as melena. Etiology is not clear, rule out occult neoplasm, chronic gastropathy, ulcer disease, AVMs  #2 history of chronic atrial fibrillation #3 hypertension #4.  Status postcholecystectomy and hysterectomy #5 hypokalemia correcting-received 4 rounds of IV potassium this morning  #6  Right pleural effusion on chest x-ray  Plan; IV PPI twice daily Trend hemoglobin every 6 hours and transfuse to keep her hemoglobin above 7 Consider IV iron while hospitalized I discussed usual indicated workup in this situation with EGD and colonoscopy.  She is fairly adamant at this point that she does not want to have a colonoscopy, and is reluctant to have endoscopy but will think about it.  We discussed  indications risks and benefits. We do not have availability for EGD for today, so either EGD, or EGD colonoscopy could be scheduled for tomorrow with Dr. Cherryl Corona.  Patient and son want to discuss and will let us  know.  Okay for clear liquids today, then n.p.o. after midnight for potential procedure. GI will follow with you.      Andi Mahaffy EsterwoodPA-C  02/09/2024, 8:27 AM

## 2024-02-09 NOTE — ED Notes (Signed)
 GI PA notified of patient's wishes.

## 2024-02-09 NOTE — ED Notes (Signed)
 Warm pack placed over infiltration site.

## 2024-02-09 NOTE — H&P (Addendum)
 History and Physical  [name] WJX:[id number] DOB: 19-May-1935 DOA: 02/08/2024  Referring physician: Verdon Glance PCP: [name], MD  Outpatient Specialists: Cardiology. Patient coming from: Home.  Chief Complaint: Low hemoglobin level and dark stools.  HPI: [name] is a 89 y.o. female with medical history significant for persistent atrial fibrillation, history of GI bleed, thus not on anticoagulation, mitral regurgitation, tricuspid regurgitation, pulmonary hypertension who presents to the ER sent by her PCP due to abnormal lab results with hemoglobin of 6.7.  Associated with dark stools for the past 2 days.  She denies any abdominal pain.  She denies any use of NSAIDs.  In the ER, tachypneic.  1 unit PRBC ordered to be transfused for hemoglobin of 6.5.  FOBT positive.  The patient was started on IV PPI twice daily.  EDP consulted [facility] Dr. Venice Gillis.  Admitted by [facility], hospitalist service.  At the time of this visit, the patient is alert and oriented x 4.  She denies having any abdominal pain.  Admits to shortness of breath with minimal exertion.  No chest pain.  ED Course: Temperature 97.8.  BP 152/93, pulse 72, respiration rate 26, O2 saturation 96% on room air.  Review of Systems: Review of systems as noted in the HPI. All other systems reviewed and are negative.   Past Medical History:  Diagnosis Date   Cancer (HCC)    skin   Hyperlipidemia    Hypertension    Past Surgical History:  Procedure Laterality Date   ABDOMINAL HYSTERECTOMY     CHOLECYSTECTOMY     GALLBLADDER SURGERY      Social History:  reports that she quit smoking about 61 years ago. Her smoking use included cigarettes. She started smoking about 66 years ago. She has a 1.3 pack-year smoking history. She has never used smokeless tobacco. She reports that she does not currently use alcohol. She reports that she does not use drugs.   Allergies  Allergen Reactions   Shellfish Allergy  Anaphylaxis and Rash   Iodinated Contrast Media Rash    Family History  Adopted: Yes  Problem Relation Age of Onset   Breast cancer Son        unsure of age   Stroke Mother    Aneurysm Brother    Parkinson's disease Brother       Prior to Admission medications   Medication Sig Start Date End Date Taking? Authorizing Provider  amLODipine (NORVASC) 5 MG tablet Take 5 mg by mouth daily at 12 noon.   Yes [provider]  Cholecalciferol (VITAMIN D3) 25 MCG (1000 UT) CAPS Take 1,000 Units by mouth daily.   Yes [provider]  cyanocobalamin 1000 MCG tablet Take 1,000 mcg by mouth daily.   Yes [provider]  irbesartan (AVAPRO) 300 MG tablet Take 300 mg by mouth daily.   Yes [provider]  OVER THE COUNTER MEDICATION Apply 1 application  topically as needed (Bil. knee pain; joint pain). RAWLEIGH'S SALVE  Apply to knees/joints daily after shower.   Yes [provider]  metoprolol  succinate (TOPROL -XL) 100 MG 24 hr tablet TAKE ONE TABLET BY MOUTH ONCE A DAY. TAKE WITH OR IMMEDIATELY FOLLOWING A MEAL Patient taking differently: Take 100 mg by mouth daily with supper. Please take this medication between 1700-1800 12/14/22  Yes Patwardhan, Manish J, MD  Multiple Vitamins-Minerals (MULTIVITAMIN ADULT PO) Take by mouth daily.   Yes [provider]  omeprazole (PRILOSEC) 40 MG capsule Take 40  mg by mouth daily.   Yes [provider]  Potassium 99 MG TABS Take by mouth daily.   Yes [provider]  pravastatin (PRAVACHOL) 80 MG tablet Take 40 mg by mouth daily. Take one-half tablet by mouth daily.   Yes [provider]  sertraline (ZOLOFT) 25 MG tablet Take 25 mg by mouth at bedtime. 02/03/22  Yes [provider]  torsemide  (DEMADEX ) 20 MG tablet 40 mg in the morning and 20 mg at night Patient taking differently: Take 20-40 mg by mouth 2 (two) times daily. Take two tablets (40mg ) by mouth in the morning.  Take one tablet (20mg ) by mouth in the afternoon around lunch time. 01/28/24  Yes Patwardhan, Kaye Parsons, MD    Physical Exam: BP 122/79   Pulse 73   Temp (!) 97.3 F (36.3 C) (Oral)   Resp (!) 22   Ht 5\' 8"  (1.727 m)   Wt 83.3 kg   SpO2 97%   BMI 27.92 kg/m   General: 89 y.o. year-old female well developed well nourished in no acute distress.  Alert and oriented x3. Cardiovascular: Regular rate and rhythm with no rubs or gallops.  No thyromegaly.  Right JVD noted.  2+ pitting edema in lower extremities bilaterally.   Respiratory: Clear to auscultation with no wheezes or rales. Good inspiratory effort. Abdomen: Soft nontender nondistended with normal bowel sounds x4 quadrants. Muskuloskeletal: No cyanosis or clubbing noted bilaterally Neuro: CN II-XII intact, strength, sensation, reflexes Skin: No ulcerative lesions noted or rashes Psychiatry: Judgement and insight appear normal. Mood is appropriate for condition and setting          Labs on Admission:  Basic Metabolic Panel: Recent Labs  Lab 02/08/24 1920  NA 140  K 3.0*  CL 102  CO2 27  GLUCOSE 122*  BUN 21  CREATININE 1.25*  CALCIUM 9.7   Liver Function Tests: Recent Labs  Lab 02/08/24 1920  AST 20  ALT 13  ALKPHOS 44  BILITOT 2.1*  PROT 6.0*  ALBUMIN 3.5   No results for input(s): "LIPASE", "AMYLASE" in the last 168 hours. No results for input(s): "AMMONIA" in the last 168 hours. CBC: Recent Labs  Lab 02/08/24 1920  WBC 3.8*  NEUTROABS 2.3  HGB 6.5*  HCT 23.0*  MCV 76.4*  PLT 222   Cardiac Enzymes: No results for input(s): "CKTOTAL", "CKMB", "CKMBINDEX", "TROPONINI" in the last 168 hours.  BNP (last 3 results) Recent Labs    02/08/24 1921  BNP 521.5*    ProBNP (last 3 results) Recent Labs    01/28/24 1006  PROBNP 3,706*    CBG: No results for input(s): "GLUCAP" in the last 168 hours.  Radiological Exams on Admission: No results found.  EKG: I independently viewed the EKG done and  my findings are as followed: None available at the time of this visit.  Assessment/Plan Present on Admission:  Symptomatic anemia  Principal Problem:   Symptomatic anemia  Symptomatic anemia Presented with hemoglobin of 6.5 (MCV 76) with exertional dyspnea. 1 unit PRBCs ordered to be transfused by EDP Repeat CBC post blood transfusion Follow iron studies, infuse IV iron if indicated. Maintain MAP greater than 65  Acute blood loss anemia Per medical records hemoglobin 13.6 in 2019. Patient does have a history of GI bleed, off anticoagulation Continue IV PPI N.p.o. until seen by GI.  Leukopenia, unclear etiology WBC 3.8 Repeat CBC post blood transfusion.  Hypokalemia Potassium 3.0 Repleted orally Check magnesium level  Isolated elevated T  bilirubin Nonspecific Repeat CMP.  Persistent atrial fibrillation, not on oral anticoagulation Rate is currently controlled Hold off home oral antihypertensives to avoid hypotension in the setting of GI bleed Closely monitor on progressive care unit  Elevated BNP, bilateral lower extremity edema Follow-up chest x-ray Follow 2D echo Start strict I's and O's and daily weight  Generalized weakness PT OT evaluation Fall precautions   Critical care time: 55 minutes.   DVT prophylaxis: SCDs  Code Status: Full code, stated by the patient herself.  Family Communication: None at bedside.  Disposition Plan: Admitted to progressive care unit.  Consults called: [facility], Dr. Venice Gillis.  Admission status: Inpatient status.   Status is: Inpatient The patient requires at least 2 midnights for further evaluation and treatment of present condition.   Bary Boss MD [facility] Pager [phone]  If 7PM-7AM, please contact night-coverage [email or web address] Password [email or web address]  02/09/2024, 2:17 AM

## 2024-02-09 NOTE — Progress Notes (Signed)
 Patient admitted to 5W. Aox4, independent with ambulation. Tele placement confirmed over the phone with 2nd R.N. Bri present. Afib rates controlled in the 70's.

## 2024-02-09 NOTE — Evaluation (Signed)
 Physical Therapy Evaluation Patient Details Name: [name] MRN: [id number] DOB: [birth year] Today's Date: 02/09/2024  History of Present Illness  89 y.o. female presents to [facility] 02/08/24 from PCP for low hemoglobin and dark stools. 1 unit PRBC transfused in ED. PMHx:  persistent atrial fibrillation, history of GI bleed, thus not on anticoagulation, mitral regurgitation, tricuspid regurgitation, pulmonary hypertension   Clinical Impression  Pt in bed upon arrival with son present and agreeable to PT eval. PTA, pt was ModI with no AD. Pt reports feeling unsteady after recent fall in 02/25. In today's session, pt required CGA to stand and MinA to ambulate 147ft with no AD. Pt would reach for UE support from rail in hallway and stagger left/right when ambulating. Recommended use of AD for stability with pt verbalizing agreement. Pt currently with functional limitations due to the deficits listed below (see PT Problem List). Pt would benefit from acute skilled PT to address functional impairments. Pt is at an increased fall risk due to 5xSTS score and history of prior fall. Recommending HHPT to work on balance and prevent future falls. Acute PT to follow.  5xSTS- 18.15 s         If plan is discharge home, recommend the following: A little help with walking and/or transfers;Assist for transportation;Help with stairs or ramp for entrance   Can travel by private vehicle    Yes    Equipment Recommendations None recommended by PT     Functional Status Assessment Patient has had a recent decline in their functional status and demonstrates the ability to make significant improvements in function in a reasonable and predictable amount of time.     Precautions / Restrictions Precautions Precautions: Fall Restrictions Weight Bearing Restrictions Per Provider Order: No      Mobility  Bed Mobility Overal bed mobility: Modified Independent     Transfers Overall transfer level: Needs  assistance Equipment used: None Transfers: Sit to/from Stand Sit to Stand: Contact guard assist     General transfer comment: CGA for safety, able to stand with no UE use    Ambulation/Gait Ambulation/Gait assistance: Min assist Gait Distance (Feet): 160 Feet Assistive device: None Gait Pattern/deviations: Step-through pattern, Decreased stride length, Staggering left, Staggering right Gait velocity: decr     General Gait Details: slightly unsteady with pt staggering left/right and reaching for UE support from rail when available. MinA for occasional slight LOB.    Balance Overall balance assessment: Needs assistance, History of Falls Sitting-balance support: No upper extremity supported, Feet supported Sitting balance-Leahy Scale: Good     Standing balance support: No upper extremity supported Standing balance-Leahy Scale: Fair Standing balance comment: able to stand statically with no AD        High level balance activites: Direction changes, Turns High Level Balance Comments: slightly unsteady when turning and with direction changes            Pertinent Vitals/Pain Pain Assessment Pain Assessment: No/denies pain    Home Living Family/patient expects to be discharged to:: Private residence Living Arrangements: Alone Available Help at Discharge: Family;Available PRN/intermittently Type of Home: Independent living facility Home Access: Level entry     Home Layout: One level Home Equipment: Rollator (4 wheels);Grab bars - toilet;Grab bars - tub/shower;Cane - single point;Wheelchair - manual      Prior Function Prior Level of Function : Independent/Modified Independent;Driving;History of Falls (last six months)    Mobility Comments: Ind with no AD, drives to the dining room. Recent fall in 2/25  due to slipping on ice ADLs Comments: Housekeeper for cleaning. Goes to dining area for lunch and cooks for other meals. Will either drive or use ILF bus for  transportation.     Extremity/Trunk Assessment   Upper Extremity Assessment Upper Extremity Assessment: Defer to OT evaluation    Lower Extremity Assessment Lower Extremity Assessment: Generalized weakness    Cervical / Trunk Assessment Cervical / Trunk Assessment: Normal  Communication   Communication Communication: No apparent difficulties    Cognition Arousal: Alert Behavior During Therapy: WFL for tasks assessed/performed   PT - Cognitive impairments: No apparent impairments    Following commands: Intact       Cueing Cueing Techniques: Verbal cues      PT Assessment Patient needs continued PT services  PT Problem List Decreased strength;Decreased activity tolerance;Decreased balance;Decreased mobility       PT Treatment Interventions Gait training;DME instruction;Functional mobility training;Therapeutic activities;Therapeutic exercise;Balance training;Patient/family education    PT Goals (Current goals can be found in the Care Plan section)  Acute Rehab PT Goals Patient Stated Goal: to gain confidence when walking PT Goal Formulation: With patient/family Time For Goal Achievement: 02/23/24 Potential to Achieve Goals: Good    Frequency Min 1X/week        AM-PAC PT "6 Clicks" Mobility  Outcome Measure Help needed turning from your back to your side while in a flat bed without using bedrails?: None Help needed moving from lying on your back to sitting on the side of a flat bed without using bedrails?: None Help needed moving to and from a bed to a chair (including a wheelchair)?: A Little Help needed standing up from a chair using your arms (e.g., wheelchair or bedside chair)?: A Little Help needed to walk in hospital room?: A Little Help needed climbing 3-5 steps with a railing? : A Little 6 Click Score: 20    End of Session Equipment Utilized During Treatment: Gait belt Activity Tolerance: Patient tolerated treatment well Patient left: in bed;with  call bell/phone within reach;with family/visitor present Nurse Communication: Mobility status PT Visit Diagnosis: Unsteadiness on feet (R26.81);Other abnormalities of gait and mobility (R26.89);Muscle weakness (generalized) (M62.81);History of falling (Z91.81)    Time: 8119-1478 PT Time Calculation (min) (ACUTE ONLY): 22 min   Charges:   PT Evaluation $PT Eval Low Complexity: 1 Low   PT General Charges $$ ACUTE PT VISIT: 1 Visit       Orysia Blas, PT, DPT Secure Chat Preferred  Rehab Office [phone]   [name] 02/09/2024, 9:59 AM

## 2024-02-09 NOTE — ED Notes (Signed)
 GI PA at bedside.

## 2024-02-09 NOTE — Progress Notes (Signed)
 PROGRESS NOTE  EPIPHANY SELTZER ZOX:[id number] DOB: 05-Jul-1935 DOA: 02/08/2024 PCP: [name], MD  HPI/Recap of past 24 hours: BULAR HICKOK is a 89 y.o. female with medical history significant for persistent Afib, history of GI bleed, thus not on anticoagulation, mitral regurgitation, tricuspid regurgitation, pulmonary hypertension who presents to the ER sent by her PCP due to abnormal lab results with hemoglobin of 6.7, associated with dark stools for the past 2 days.  She denies any abdominal pain.  She denies any use of NSAIDs. In the ER, tachypneic, FOBT positive.  The patient was started on IV PPI twice daily.  EDP consulted [facility] Dr. Venice Gillis.  Admitted by [facility], hospitalist service.     Today, patient denies any new complaints, initially reluctant to have EGD or colonoscopy.  Patient has since agreed to have only EGD and continues to refuse colonoscopy.  EGD planned for 5/22.  Patient denies any abdominal pain, worsening shortness of breath, fever/chills.  Noted to have bilateral lower extremity edema.  Discussed with brother at bedside.    Assessment/Plan: Principal Problem:   Symptomatic anemia   Symptomatic anemia/iron deficiency anemia Presented with hemoglobin of 6.5 (MCV 76) with exertional dyspnea Anemia panel with sats 5, ferritin 9, TIBC 528, iron 27 S/p 1 unit PRBCs Ordered 1 dose of IV iron Trend CBC Monitor closely for any obvious signs of bleeding   Possible upper GI bleed Per medical records hemoglobin 13.6 in 2019. Patient does have a history of GI bleed, off anticoagulation CT abdomen/pelvis without contrast pending Continue IV PPI GI on board, recommend EGD and colonoscopy, patient agreed for EGD but refuses colonoscopy EGD plan for 5/22 N.p.o. at midnight   Leukopenia, unclear etiology Monitor CBC   Hypokalemia Repleted orally Check magnesium level On torsemide  at home  Mild AKI Daily CMP   Isolated elevated T  bilirubin Nonspecific Repeat CMP   Persistent atrial fibrillation, not on oral anticoagulation Rate is currently controlled Closely monitor on progressive care unit   Elevated BNP, bilateral lower extremity edema Currently on room air saturating well Chest x-ray with right-sided pleural effusion Echo showed EF of 60 to 65%, no regional wall motion abnormality, left ventricular diastolic function could not be evaluated, severely elevated pulmonary artery systolic pressure Start IV Lasix Strict I's and O's and daily weight   Generalized weakness PT OT evaluation Fall precautions     Estimated body mass index is 27.92 kg/m as calculated from the following:   Height as of this encounter: 5\' 8"  (1.727 m).   Weight as of this encounter: 83.3 kg.     Code Status: Full  Family Communication: Brother at bedside  Disposition Plan: Status is: Inpatient Remains inpatient appropriate because: Level of care      Consultants: GI  Procedures: None  Antimicrobials: None  DVT prophylaxis: SCDs   Objective: Vitals:   02/09/24 1602 02/09/24 1700 02/09/24 1732 02/09/24 1800  BP: (!) 175/80 (!) 171/84 (!) 171/84 (!) 159/80  Pulse: 76 84 84 69  Resp: 15 18  18   Temp:    97.9 F (36.6 C)  TempSrc:      SpO2: 92% 94%  96%  Weight:      Height:        Intake/Output Summary (Last 24 hours) at 02/09/2024 1846 Last data filed at 02/09/2024 0412 Gross per 24 hour  Intake 349 ml  Output --  Net 349 ml   Filed Weights   02/08/24 2201  Weight: 83.3 kg  Exam: General: NAD  Cardiovascular: S1, S2 present Respiratory: CTAB Abdomen: Soft, nontender, nondistended, bowel sounds present Musculoskeletal: bilateral pedal edema noted Skin: Normal Psychiatry: Normal mood     Data Reviewed: CBC: Recent Labs  Lab 02/08/24 1920 02/09/24 0609 02/09/24 1028 02/09/24 1614  WBC 3.8* 3.2*  --   --   NEUTROABS 2.3  --   --   --   HGB 6.5* 7.6* 7.5* 7.7*  HCT 23.0* 25.5*  25.9* 26.4*  MCV 76.4* 71.2*  --   --   PLT 222 192  --   --    Basic Metabolic Panel: Recent Labs  Lab 02/08/24 1920 02/09/24 0609  NA 140 138  K 3.0* 2.9*  CL 102 103  CO2 27 25  GLUCOSE 122* 122*  BUN 21 20  CREATININE 1.25* 1.21*  CALCIUM 9.7 9.3  MG  --  1.7  PHOS  --  3.1   GFR: Estimated Creatinine Clearance: 37.8 mL/min (A) (by C-G formula based on SCr of 1.21 mg/dL (H)). Liver Function Tests: Recent Labs  Lab 02/08/24 1920 02/09/24 0609  AST 20 23  ALT 13 13  ALKPHOS 44 43  BILITOT 2.1* 2.2*  PROT 6.0* 6.0*  ALBUMIN 3.5 3.5   No results for input(s): "LIPASE", "AMYLASE" in the last 168 hours. No results for input(s): "AMMONIA" in the last 168 hours. Coagulation Profile: No results for input(s): "INR", "PROTIME" in the last 168 hours. Cardiac Enzymes: No results for input(s): "CKTOTAL", "CKMB", "CKMBINDEX", "TROPONINI" in the last 168 hours. BNP (last 3 results) Recent Labs    01/28/24 1006  PROBNP 3,706*   HbA1C: No results for input(s): "HGBA1C" in the last 72 hours. CBG: No results for input(s): "GLUCAP" in the last 168 hours. Lipid Profile: No results for input(s): "CHOL", "HDL", "LDLCALC", "TRIG", "CHOLHDL", "LDLDIRECT" in the last 72 hours. Thyroid  Function Tests: No results for input(s): "TSH", "T4TOTAL", "FREET4", "T3FREE", "THYROIDAB" in the last 72 hours. Anemia Panel: Recent Labs    02/09/24 0609  FERRITIN 9*  TIBC 528*  IRON 27*  RETICCTPCT 1.3   Urine analysis:    Component Value Date/Time   COLORURINE YELLOW 02/08/2024 2042   APPEARANCEUR CLEAR 02/08/2024 2042   LABSPEC 1.008 02/08/2024 2042   PHURINE 6.0 02/08/2024 2042   GLUCOSEU NEGATIVE 02/08/2024 2042   HGBUR NEGATIVE 02/08/2024 2042   BILIRUBINUR NEGATIVE 02/08/2024 2042   KETONESUR NEGATIVE 02/08/2024 2042   PROTEINUR NEGATIVE 02/08/2024 2042   UROBILINOGEN 1.0 11/22/2007 2152   NITRITE NEGATIVE 02/08/2024 2042   LEUKOCYTESUR MODERATE (A) 02/08/2024 2042    Sepsis Labs: @LABRCNTIP (procalcitonin:4,lacticidven:4)  )No results found for this or any previous visit (from the past 240 hours).    Studies: ECHOCARDIOGRAM COMPLETE Result Date: 02/09/2024    ECHOCARDIOGRAM REPORT   Patient Name:   TIMBERLY YOTT Date of Exam: 02/09/2024 Medical Rec #:  [id number]        Height:       68.0 in Accession #:    [id number]       Weight:       183.6 lb Date of Birth:  May 17, 1935        BSA:          1.971 m Patient Age:    88 years         BP:           152/58 mmHg Patient Gender: F                HR:  77 bpm. Exam Location:  Inpatient Procedure: 2D Echo, Cardiac Doppler and Color Doppler (Both Spectral and Color            Flow Doppler were utilized during procedure). Indications:    CHF  History:        Patient has no prior history of Echocardiogram examinations.                 Risk Factors:Hypertension.  Sonographer:    Janette Medley Referring Phys: CAROLE N HALL IMPRESSIONS  1. Left ventricular ejection fraction, by estimation, is 60 to 65%. The left ventricle has normal function. The left ventricle has no regional wall motion abnormalities. Left ventricular diastolic function could not be evaluated.  2. Right ventricular systolic function is normal. The right ventricular size is moderately enlarged. There is severely elevated pulmonary artery systolic pressure.  3. Left atrial size was severely dilated.  4. Right atrial size was severely dilated.  5. The mitral valve is normal in structure. Trivial mitral valve regurgitation. No evidence of mitral stenosis. Moderate mitral annular calcification.  6. Tricuspid valve regurgitation is severe.  7. The aortic valve is tricuspid. There is moderate calcification of the aortic valve. There is moderate thickening of the aortic valve. Aortic valve regurgitation is trivial. Aortic valve sclerosis/calcification is present, without any evidence of aortic stenosis. Aortic valve area, by VTI measures 1.15 cm. Aortic valve  mean gradient measures 9.0 mmHg. Aortic valve Vmax measures 2.07 m/s.  8. The inferior vena cava is normal in size with greater than 50% respiratory variability, suggesting right atrial pressure of 3 mmHg. FINDINGS  Left Ventricle: Left ventricular ejection fraction, by estimation, is 60 to 65%. The left ventricle has normal function. The left ventricle has no regional wall motion abnormalities. The left ventricular internal cavity size was normal in size. There is  no left ventricular hypertrophy. Left ventricular diastolic function could not be evaluated due to atrial fibrillation. Left ventricular diastolic function could not be evaluated. Right Ventricle: The right ventricular size is moderately enlarged. No increase in right ventricular wall thickness. Right ventricular systolic function is normal. There is severely elevated pulmonary artery systolic pressure. The tricuspid regurgitant velocity is 3.57 m/s, and with an assumed right atrial pressure of 15 mmHg, the estimated right ventricular systolic pressure is 66.0 mmHg. Left Atrium: Left atrial size was severely dilated. Right Atrium: Right atrial size was severely dilated. Pericardium: There is no evidence of pericardial effusion. Mitral Valve: The mitral valve is normal in structure. Moderate mitral annular calcification. Trivial mitral valve regurgitation. No evidence of mitral valve stenosis. Tricuspid Valve: The tricuspid valve is normal in structure. Tricuspid valve regurgitation is severe. No evidence of tricuspid stenosis. Aortic Valve: The aortic valve is tricuspid. There is moderate calcification of the aortic valve. There is moderate thickening of the aortic valve. Aortic valve regurgitation is trivial. Aortic valve sclerosis/calcification is present, without any evidence of aortic stenosis. Aortic valve mean gradient measures 9.0 mmHg. Aortic valve peak gradient measures 17.1 mmHg. Aortic valve area, by VTI measures 1.15 cm. Pulmonic Valve: The  pulmonic valve was normal in structure. Pulmonic valve regurgitation is not visualized. No evidence of pulmonic stenosis. Aorta: The aortic root is normal in size and structure. Venous: The inferior vena cava is normal in size with greater than 50% respiratory variability, suggesting right atrial pressure of 3 mmHg. IAS/Shunts: No atrial level shunt detected by color flow Doppler.  LEFT VENTRICLE PLAX 2D LVIDd:  3.40 cm   Diastology LVIDs:         2.20 cm   LV e' medial:  14.80 cm/s LV PW:         0.90 cm   LV e' lateral: 18.50 cm/s LV IVS:        1.20 cm LVOT diam:     1.80 cm LV SV:         45 LV SV Index:   23 LVOT Area:     2.54 cm  RIGHT VENTRICLE             IVC RV S prime:     15.00 cm/s  IVC diam: 3.10 cm TAPSE (M-mode): 2.3 cm LEFT ATRIUM             Index        RIGHT ATRIUM           Index LA diam:        4.00 cm 2.03 cm/m   RA Area:     34.20 cm LA Vol (A2C):   81.3 ml 41.25 ml/m  RA Volume:   128.00 ml 64.94 ml/m LA Vol (A4C):   95.4 ml 48.40 ml/m LA Biplane Vol: 89.0 ml 45.16 ml/m  AORTIC VALVE AV Area (Vmax):    1.11 cm AV Area (Vmean):   1.14 cm AV Area (VTI):     1.15 cm AV Vmax:           207.00 cm/s AV Vmean:          138.500 cm/s AV VTI:            0.388 m AV Peak Grad:      17.1 mmHg AV Mean Grad:      9.0 mmHg LVOT Vmax:         90.17 cm/s LVOT Vmean:        62.300 cm/s LVOT VTI:          0.176 m LVOT/AV VTI ratio: 0.45  AORTA Ao Root diam: 2.60 cm Ao Asc diam:  3.50 cm TRICUSPID VALVE TR Peak grad:   51.0 mmHg TR Vmax:        357.00 cm/s  SHUNTS Systemic VTI:  0.18 m Systemic Diam: 1.80 cm Maudine Sos MD Electronically signed by Maudine Sos MD Signature Date/Time: 02/09/2024/2:28:07 PM    Final    DG CHEST PORT 1 VIEW Result Date: 02/09/2024 CLINICAL DATA:  Dyspnea EXAM: PORTABLE CHEST 1 VIEW COMPARISON:  12/27/2004 FINDINGS: Cardiac shadow is enlarged. Aortic calcifications are noted. Lungs are well aerated bilaterally. Right-sided pleural effusion is seen. No  bony abnormality is noted. IMPRESSION: Right-sided pleural effusion. Electronically Signed   By: Violeta Grey M.D.   On: 02/09/2024 02:53    Scheduled Meds:  metoprolol  succinate  50 mg Oral Q supper   pantoprazole (PROTONIX) IV  40 mg Intravenous Q12H   potassium chloride  40 mEq Oral BID   pravastatin  40 mg Oral Daily   sertraline  25 mg Oral QHS    Continuous Infusions:   LOS: 1 day     Broden Holt J Kevonta Phariss, MD [facility]  If 7PM-7AM, please contact night-coverage [email or web address] 02/09/2024, 6:46 PM

## 2024-02-09 NOTE — Consult Note (Addendum)
 Consultation  Referring Provider: [facility]/ Abbey Abbe Primary Care Physician:  [name], MD Primary Gastroenterologist:  Janie Meier remote DR Alethia Huxley  Reason for Consultation:  melena, anemia  HPI: [name] is a 89 y.o. female with history of hypertension, hyperlipidemia, previous cholecystectomy and hysterectomy with history of chronic atrial fibrillation.  She and her son states she has not been on any blood thinners or aspirin.  She had been on a blood thinner in the past and it sounds as if within the past 2 years she had developed heme positive stool and anemia and patient decided to stop the blood thinner.  She was also on aspirin for period of time which was also stopped she says she started taking a multivitamin with iron and was told that her anemia improved by her PCP at [facility]. She is not on any regular NSAIDs.  She says that she has not been feeling well over the past couple of months has noticed onset of fatigue and some shortness of breath with exertion.  She also developed some lower extremity edema within the past couple of months and had been put on Lasix 60 mg p.o. daily. Appetite has been decreased, no complaints of heartburn or indigestion no dysphagia or odynophagia.  She has had some occasional episodes of nausea with vomiting, no heme.  She says she has been feeling uncomfortable or queasy sometimes with an empty stomach.  She says her bowel movements have been normal usually 1 to 2/day.  She had not noticed any melena or hematochezia until over the past 3 to 4 days when she noticed that her stool became black.  She was seen outpatient by primary care yesterday found to have hemoglobin of 6.7 and referred to the emergency room.  Noted to have overt melena here per ER MD, hemodynamically stable. She reports very remote colonoscopy and EGD per Dr. Alethia Huxley probably 20 years ago and was never told that she had any problems.  Labs here on arrival WBC  3.8/hemoglobin 6.5/hematocrit 23/MCV 76/platelets 222 BNP 521 Sodium 140/potassium 3.0/BUN 21/creatinine 1.25 LFTs within normal limits/T. bili 2.1 UA positive  Ferritin 9/serum iron 27/TIBC 528/iron sat 5 Potassium was 2.9 earlier this a.m.  She has since had 4 runs of potassium. Hemoglobin today 7.6 after 1 unit of packed RBCs.  Says she feels a little better, last stool earlier this morning.   Past Medical History:  Diagnosis Date   Cancer (HCC)    skin   Hyperlipidemia    Hypertension     Past Surgical History:  Procedure Laterality Date   ABDOMINAL HYSTERECTOMY     CHOLECYSTECTOMY     GALLBLADDER SURGERY      Prior to Admission medications   Medication Sig Start Date End Date Taking? Authorizing Provider  amLODipine (NORVASC) 5 MG tablet Take 5 mg by mouth daily at 12 noon.   Yes [provider]  Cholecalciferol (VITAMIN D3) 25 MCG (1000 UT) CAPS Take 1,000 Units by mouth daily.   Yes [provider]  cyanocobalamin 1000 MCG tablet Take 1,000 mcg by mouth daily.   Yes [provider]  irbesartan (AVAPRO) 300 MG tablet Take 300 mg by mouth daily.   Yes [provider]  metoprolol  succinate (TOPROL -XL) 100 MG 24 hr tablet TAKE ONE TABLET BY MOUTH ONCE A DAY. TAKE WITH OR IMMEDIATELY FOLLOWING A MEAL Patient taking differently: Take 100 mg by mouth daily with supper. Please take this medication between 1700-1800 12/14/22  Yes  Patwardhan, Manish J, MD  Multiple Vitamins-Minerals (MULTIVITAMIN ADULT PO) Take by mouth daily.   Yes [provider]  omeprazole (PRILOSEC) 40 MG capsule Take 40 mg by mouth daily.   Yes [provider]  OVER THE COUNTER MEDICATION Apply 1 application  topically as needed (Bil. knee pain; joint pain). RAWLEIGH'S SALVE  Apply to knees/joints daily after shower.   Yes [provider]  Potassium 99 MG TABS Take by mouth daily.   Yes [provider]  pravastatin (PRAVACHOL) 80 MG  tablet Take 40 mg by mouth daily. Take one-half tablet by mouth daily.   Yes [provider]  sertraline (ZOLOFT) 25 MG tablet Take 25 mg by mouth at bedtime. 02/03/22  Yes [provider]  torsemide  (DEMADEX ) 20 MG tablet 40 mg in the morning and 20 mg at night Patient taking differently: Take 20-40 mg by mouth 2 (two) times daily. Take two tablets (40mg ) by mouth in the morning. Take one tablet (20mg ) by mouth in the afternoon around lunch time. 01/28/24  Yes Patwardhan, Kaye Parsons, MD    Current Facility-Administered Medications  Medication Dose Route Frequency Provider Last Rate Last Admin   acetaminophen  (TYLENOL ) tablet 650 mg  650 mg Oral Q6H PRN Hall, Carole N, DO       melatonin tablet 5 mg  5 mg Oral QHS PRN Reesa Cannon N, DO       pantoprazole (PROTONIX) injection 40 mg  40 mg Intravenous Q12H Prosperi, Christian H, PA-C       polyethylene glycol (MIRALAX / GLYCOLAX) packet 17 g  17 g Oral Daily PRN Hall, Carole N, DO       potassium chloride SA (KLOR-CON M) CR tablet 40 mEq  40 mEq Oral BID Reesa Cannon N, DO   40 mEq at 02/09/24 4098   prochlorperazine (COMPAZINE) injection 5 mg  5 mg Intravenous Q6H PRN Bary Boss, DO       Current Outpatient Medications  Medication Sig Dispense Refill   amLODipine (NORVASC) 5 MG tablet Take 5 mg by mouth daily at 12 noon.     Cholecalciferol (VITAMIN D3) 25 MCG (1000 UT) CAPS Take 1,000 Units by mouth daily.     cyanocobalamin 1000 MCG tablet Take 1,000 mcg by mouth daily.     irbesartan (AVAPRO) 300 MG tablet Take 300 mg by mouth daily.     metoprolol  succinate (TOPROL -XL) 100 MG 24 hr tablet TAKE ONE TABLET BY MOUTH ONCE A DAY. TAKE WITH OR IMMEDIATELY FOLLOWING A MEAL (Patient taking differently: Take 100 mg by mouth daily with supper. Please take this medication between 1700-1800) 90 tablet 3   Multiple Vitamins-Minerals (MULTIVITAMIN ADULT PO) Take by mouth daily.     omeprazole (PRILOSEC) 40 MG capsule Take 40 mg by mouth  daily.     OVER THE COUNTER MEDICATION Apply 1 application  topically as needed (Bil. knee pain; joint pain). RAWLEIGH'S SALVE  Apply to knees/joints daily after shower.     Potassium 99 MG TABS Take by mouth daily.     pravastatin (PRAVACHOL) 80 MG tablet Take 40 mg by mouth daily. Take one-half tablet by mouth daily.     sertraline (ZOLOFT) 25 MG tablet Take 25 mg by mouth at bedtime.     torsemide  (DEMADEX ) 20 MG tablet 40 mg in the morning and 20 mg at night (Patient taking differently: Take 20-40 mg by mouth 2 (two) times daily. Take two tablets (40mg ) by mouth in the morning. Take  one tablet (20mg ) by mouth in the afternoon around lunch time.) 270 tablet 3    Allergies as of 02/08/2024 - Review Complete 02/08/2024  Allergen Reaction Noted   Shellfish allergy Anaphylaxis and Rash 02/05/2018   Iodinated contrast media Rash 10/08/2021    Family History  Adopted: Yes  Problem Relation Age of Onset   Breast cancer Son        unsure of age   Stroke Mother    Aneurysm Brother    Parkinson's disease Brother     Social History   Socioeconomic History   Marital status: Married    Spouse name: Not on file   Number of children: 2   Years of education: Not on file   Highest education level: Not on file  Occupational History   Not on file  Tobacco Use   Smoking status: Former    Current packs/day: 0.00    Average packs/day: 0.3 packs/day for 5.0 years (1.3 ttl pk-yrs)    Types: Cigarettes    Start date: 107    Quit date: 1964    Years since quitting: 61.4   Smokeless tobacco: Never  Vaping Use   Vaping status: Never Used  Substance and Sexual Activity   Alcohol use: Not Currently   Drug use: Never   Sexual activity: Not on file  Other Topics Concern   Not on file  Social History Narrative   Not on file   Social Drivers of Health   Financial Resource Strain: Not on file  Food Insecurity: Not on file  Transportation Needs: Not on file  Physical Activity: Not on  file  Stress: Not on file  Social Connections: Not on file  Intimate Partner Violence: Not on file    Review of Systems: Pertinent positive and negative review of systems were noted in the above HPI section.  All other review of systems was otherwise negative.  Physical Exam: Vital signs in last 24 hours: Temp:  [97.3 F (36.3 C)-98.1 F (36.7 C)] 98.1 F (36.7 C) (05/21 0726) Pulse Rate:  [67-81] 81 (05/21 0715) Resp:  [15-25] 25 (05/21 0715) BP: (118-156)/(56-93) 155/83 (05/21 0715) SpO2:  [93 %-100 %] 97 % (05/21 0715) Weight:  [83.3 kg] 83.3 kg (05/20 2201)   General:   Alert,  Well-developed, well-nourished, elderly white female pleasant and cooperative in NAD-son at bedside Head:  Normocephalic and atraumatic. Eyes:  Sclera clear, no icterus.   Conjunctiva pale Ears:  Normal auditory acuity. Nose:  No deformity, discharge,  or lesions. Mouth:  No deformity or lesions.   Neck:  Supple; no masses or thyromegaly. Lungs:  Clear throughout to auscultation.   No wheezes, crackles, or rhonchi.  Heart:  Regular rate and rhythm; no murmurs, clicks, rubs,  or gallops. Abdomen:  Soft,nontender, BS active,nonpalp mass or hsm.   Rectal: Done, melena documented per ER Msk:  Symmetrical without gross deformities. . Pulses:  Normal pulses noted. Extremities:  Without clubbing or edema. Neurologic:  Alert and  oriented x4;  grossly normal neurologically. Skin:  Intact without significant lesions or rashes.. Psych:  Alert and cooperative. Normal mood and affect.  Intake/Output from previous day: 05/20 0701 - 05/21 0700 In: 349 [I.V.:34; Blood:315] Out: -  Intake/Output this shift: No intake/output data recorded.  Lab Results: Recent Labs    02/08/24 1920 02/09/24 0609  WBC 3.8* 3.2*  HGB 6.5* 7.6*  HCT 23.0* 25.5*  PLT 222 192   BMET Recent Labs    02/08/24 1920 02/09/24  0609  NA 140 138  K 3.0* 2.9*  CL 102 103  CO2 27 25  GLUCOSE 122* 122*  BUN 21 20   CREATININE 1.25* 1.21*  CALCIUM 9.7 9.3   LFT Recent Labs    02/09/24 0609  PROT 6.0*  ALBUMIN 3.5  AST 23  ALT 13  ALKPHOS 43  BILITOT 2.2*   PT/INR No results for input(s): "LABPROT", "INR" in the last 72 hours. Hepatitis Panel No results for input(s): "HEPBSAG", "HCVAB", "HEPAIGM", "HEPBIGM" in the last 72 hours.    IMPRESSION:  #42 89 year old white female presenting with 3 to 4-day history of melena and 72-month history of fatigue and exertional dyspnea. No blood thinners, no aspirin or NSAIDs. She has been having some intermittent queasiness more notable with empty stomach and has had a couple of episodes of nausea and vomiting over the past couple of months, decreased appetite.  Had lost about 6 pounds but also had been placed on Lasix.  She reports being told that she was anemic and had heme positive stool within the past 2 years at which time she had been on a blood thinner for atrial fibrillation.  Ultimately she decided to stop all anticoagulation including aspirin, took a over-the-counter vitamin with iron and reports that her anemia improved and no further workup was pursued.  Patient presents now with profound anemia hemoglobin of 6.5 and significant iron deficiency as well as melena. Etiology is not clear, rule out occult neoplasm, chronic gastropathy, ulcer disease, AVMs  #2 history of chronic atrial fibrillation #3 hypertension #4.  Status postcholecystectomy and hysterectomy #5 hypokalemia correcting-received 4 rounds of IV potassium this morning  #6  Right pleural effusion on chest x-ray  Plan; IV PPI twice daily Trend hemoglobin every 6 hours and transfuse to keep her hemoglobin above 7 Consider IV iron while hospitalized I discussed usual indicated workup in this situation with EGD and colonoscopy.  She is fairly adamant at this point that she does not want to have a colonoscopy, and is reluctant to have endoscopy but will think about it.  We discussed  indications risks and benefits. We do not have availability for EGD for today, so either EGD, or EGD colonoscopy could be scheduled for tomorrow with Dr. Cherryl Corona.  Patient and son want to discuss and will let us  know.  Okay for clear liquids today, then n.p.o. after midnight for potential procedure. GI will follow with you.      Andi Mahaffy EsterwoodPA-C  02/09/2024, 8:27 AM

## 2024-02-10 ENCOUNTER — Inpatient Hospital Stay (HOSPITAL_COMMUNITY): Admitting: Registered Nurse

## 2024-02-10 ENCOUNTER — Encounter: Payer: Self-pay | Admitting: Cardiology

## 2024-02-10 ENCOUNTER — Encounter (HOSPITAL_COMMUNITY)
Admission: EM | Disposition: A | Discharge status: Home Health | Payer: Self-pay | Source: Home / Self Care | Attending: Internal Medicine

## 2024-02-10 DIAGNOSIS — Z87891 Personal history of nicotine dependence: Secondary | ICD-10-CM | POA: Diagnosis not present

## 2024-02-10 DIAGNOSIS — I10 Essential (primary) hypertension: Secondary | ICD-10-CM

## 2024-02-10 DIAGNOSIS — D509 Iron deficiency anemia, unspecified: Secondary | ICD-10-CM | POA: Diagnosis not present

## 2024-02-10 DIAGNOSIS — K3189 Other diseases of stomach and duodenum: Secondary | ICD-10-CM

## 2024-02-10 DIAGNOSIS — R195 Other fecal abnormalities: Secondary | ICD-10-CM | POA: Diagnosis not present

## 2024-02-10 DIAGNOSIS — D500 Iron deficiency anemia secondary to blood loss (chronic): Secondary | ICD-10-CM

## 2024-02-10 HISTORY — PX: BIOPSY OF SKIN SUBCUTANEOUS TISSUE AND/OR MUCOUS MEMBRANE: SHX6741

## 2024-02-10 HISTORY — PX: ESOPHAGOGASTRODUODENOSCOPY: SHX5428

## 2024-02-10 LAB — HEMOGLOBIN AND HEMATOCRIT, BLOOD
HCT: 24.7 % — ABNORMAL LOW (ref 36.0–46.0)
HCT: 31.8 % — ABNORMAL LOW (ref 36.0–46.0)
Hemoglobin: 7.4 g/dL — ABNORMAL LOW (ref 12.0–15.0)
Hemoglobin: 9.4 g/dL — ABNORMAL LOW (ref 12.0–15.0)

## 2024-02-10 LAB — COMPREHENSIVE METABOLIC PANEL WITH GFR
ALT: 11 U/L (ref 0–44)
AST: 28 U/L (ref 15–41)
Albumin: 3.3 g/dL — ABNORMAL LOW (ref 3.5–5.0)
Alkaline Phosphatase: 40 U/L (ref 38–126)
Anion gap: 12 (ref 5–15)
BUN: 16 mg/dL (ref 8–23)
CO2: 22 mmol/L (ref 22–32)
Calcium: 9.4 mg/dL (ref 8.9–10.3)
Chloride: 104 mmol/L (ref 98–111)
Creatinine, Ser: 1.17 mg/dL — ABNORMAL HIGH (ref 0.44–1.00)
GFR, Estimated: 45 mL/min — ABNORMAL LOW (ref >60)
Glucose, Bld: 116 mg/dL — ABNORMAL HIGH (ref 70–99)
Potassium: 3.5 mmol/L (ref 3.5–5.1)
Sodium: 138 mmol/L (ref 135–145)
Total Bilirubin: 2.8 mg/dL — ABNORMAL HIGH (ref 0.0–1.2)
Total Protein: 5.6 g/dL — ABNORMAL LOW (ref 6.5–8.1)

## 2024-02-10 LAB — LIPASE, BLOOD: Lipase: 22 U/L (ref 11–51)

## 2024-02-10 LAB — PREPARE RBC (CROSSMATCH)

## 2024-02-10 SURGERY — EGD (ESOPHAGOGASTRODUODENOSCOPY)
Anesthesia: Monitor Anesthesia Care

## 2024-02-10 MED ORDER — LORAZEPAM 0.5 MG PO TABS: 0.5000 mg | ORAL_TABLET | Freq: Once | ORAL | Status: DC

## 2024-02-10 MED ORDER — IRON SUCROSE 400 MG IVPB - SIMPLE MED
400.0000 mg | Freq: Once | Status: AC
  Administered 2024-02-11: 400 mg via INTRAVENOUS
  Filled 2024-02-10: qty 20

## 2024-02-10 MED ORDER — LIDOCAINE 2% (20 MG/ML) 5 ML SYRINGE
INTRAMUSCULAR | Status: DC | PRN
  Administered 2024-02-10: 100 mg via INTRAVENOUS

## 2024-02-10 MED ORDER — SODIUM CHLORIDE 0.9 % IV SOLN
INTRAVENOUS | Status: AC | PRN
  Administered 2024-02-10: 250 mL via INTRAVENOUS

## 2024-02-10 MED ORDER — IRBESARTAN 75 MG PO TABS
75.0000 mg | ORAL_TABLET | Freq: Every day | ORAL | Status: DC
  Administered 2024-02-10 – 2024-02-11 (×2): 75 mg via ORAL
  Filled 2024-02-10 (×2): qty 1

## 2024-02-10 MED ORDER — GLYCOPYRROLATE PF 0.2 MG/ML IJ SOSY
PREFILLED_SYRINGE | INTRAMUSCULAR | Status: DC | PRN
  Administered 2024-02-10: .1 mg via INTRAVENOUS

## 2024-02-10 MED ORDER — SENNOSIDES-DOCUSATE SODIUM 8.6-50 MG PO TABS
2.0000 | ORAL_TABLET | Freq: Every day | ORAL | Status: DC
Start: 2024-02-10 — End: 2024-02-11
  Administered 2024-02-10: 2 via ORAL
  Filled 2024-02-10: qty 2

## 2024-02-10 MED ORDER — DIPHENHYDRAMINE HCL 25 MG PO CAPS
25.0000 mg | ORAL_CAPSULE | Freq: Once | ORAL | Status: AC
  Administered 2024-02-10: 25 mg via ORAL
  Filled 2024-02-10: qty 1

## 2024-02-10 MED ORDER — HYDRALAZINE HCL 20 MG/ML IJ SOLN: 10.0000 mg | Freq: Three times a day (TID) | INTRAMUSCULAR | Status: DC | PRN

## 2024-02-10 MED ORDER — ACETAMINOPHEN 325 MG PO TABS
650.0000 mg | ORAL_TABLET | Freq: Once | ORAL | Status: AC
  Administered 2024-02-10: 650 mg via ORAL
  Filled 2024-02-10: qty 2

## 2024-02-10 MED ORDER — LORAZEPAM 1 MG PO TABS
1.0000 mg | ORAL_TABLET | ORAL | Status: AC
Start: 2024-02-10 — End: 2024-02-11

## 2024-02-10 MED ORDER — PROPOFOL 10 MG/ML IV BOLUS
INTRAVENOUS | Status: DC | PRN
  Administered 2024-02-10 (×2): 30 mg via INTRAVENOUS
  Administered 2024-02-10: 100 ug/kg/min via INTRAVENOUS

## 2024-02-10 MED ORDER — FUROSEMIDE 10 MG/ML IJ SOLN
40.0000 mg | Freq: Two times a day (BID) | INTRAMUSCULAR | Status: DC
  Administered 2024-02-10 (×2): 40 mg via INTRAVENOUS
  Filled 2024-02-10 (×2): qty 4

## 2024-02-10 MED ORDER — LORAZEPAM 0.5 MG PO TABS: 0.5000 mg | ORAL_TABLET | ORAL | Status: DC

## 2024-02-10 MED ORDER — SODIUM CHLORIDE 0.9% IV SOLUTION: Freq: Once | INTRAVENOUS | Status: AC

## 2024-02-10 NOTE — Evaluation (Signed)
 Clinical/Bedside Swallow Evaluation Patient Details  Name: [name] MRN: [id number] Date of Birth: 1934/12/08  Today's Date: 02/10/2024 Time: SLP Start Time (ACUTE ONLY): 1113 SLP Stop Time (ACUTE ONLY): 1130 SLP Time Calculation (min) (ACUTE ONLY): 17 min  Past Medical History:  Past Medical History:  Diagnosis Date   Cancer (HCC)    skin   Hyperlipidemia    Hypertension    Past Surgical History:  Past Surgical History:  Procedure Laterality Date   ABDOMINAL HYSTERECTOMY     CHOLECYSTECTOMY     GALLBLADDER SURGERY     HPI:  Patient is a 89 y.o.  female with history of persistent atrial fibrillation not on anticoagulation, chronic HFpEF, pulmonary hypertension, mitral/tricuspid regurgitation-presented with black stools and a hemoglobin level of 6.7.  She underwent an EGD for anemia early this morning, 5/22.    Assessment / Plan / Recommendation  Clinical Impression   SLP consulted following choking incident this morning with scrambled eggs. Pt underwent an EGD early this morning for anemia (no prior complaints of dysphagia). Physician cleared pt to start a regular diet following EGD. Pt attributed coughing incident to dryness of eggs and pepper; however, she has not tried anything else since this incident.   SLP prompted trials of thin liquids, applesauce, and cracker.  Pt presents with a grossly functional oropharyngeal swallow per clinical swallow assessment completed today. Oral prep and transit prompt with complete oral clearance. Pharyngeal swallow initiation appeared timely with laryngeal elevation noted. No overt or subtle s/s of aspiration noted across consistencies. Pt did cautiously take small bites and sips throughout evaluation.   Recommend continue regular diet and thin liquids at this time. Okay for PO meds as tolerated. SLP will follow up x1 to assess diet tolerance. Discussed option of MBSS with esophageal sweep if any further coughing incidents or concerns with  diet tolerance arise. Pt would like to avoid an MBSS unless it is needed. SLP encouraged pt to select softer food options for today.   SLP Visit Diagnosis:  (r/o oropharyngeal dysphagia)    Aspiration Risk  No limitations    Diet Recommendation Regular;Thin liquid    Liquid Administration via: Cup;Straw Medication Administration: Whole meds with liquid Supervision: Patient able to self feed Compensations: Slow rate;Small sips/bites Postural Changes: Seated upright at 90 degrees    Other  Recommendations Oral Care Recommendations: Oral care BID    Recommendations for follow up therapy are one component of a multi-disciplinary discharge planning process, led by the attending physician.  Recommendations may be updated based on patient status, additional functional criteria and insurance authorization.  Follow up Recommendations  (no follow up SLP needs anticipated)      Assistance Recommended at Discharge  Defer to PT/OT recommendations   Functional Status Assessment Patient has had a recent decline in their functional status and demonstrates the ability to make significant improvements in function in a reasonable and predictable amount of time.  Frequency and Duration min 1 x/week  1 week       Prognosis Prognosis for improved oropharyngeal function: Good      Swallow Study   General Date of Onset: 02/08/24 HPI: Patient is a 89 y.o.  female with history of persistent atrial fibrillation not on anticoagulation, chronic HFpEF, pulmonary hypertension, mitral/tricuspid regurgitation-presented with black stools and a hemoglobin level of 6.7.  She underwent an EGD for anemia early this morning, 5/22. Type of Study: Bedside Swallow Evaluation Previous Swallow Assessment: none per chart; no hx of dysphagia per  pt and son Diet Prior to this Study: Regular;Thin liquids (Level 0) (meal tray is held by RN until evaluated by SLP) Temperature Spikes Noted: No Respiratory Status: Room  air History of Recent Intubation: No Behavior/Cognition: Alert;Cooperative;Pleasant mood Oral Cavity Assessment: Within Functional Limits Oral Cavity - Dentition: Adequate natural dentition Vision: Functional for self-feeding Self-Feeding Abilities: Able to feed self Patient Positioning: Upright in bed Baseline Vocal Quality: Normal Volitional Cough: Strong Volitional Swallow: Able to elicit    Oral/Motor/Sensory Function Overall Oral Motor/Sensory Function: Within functional limits   Ice Chips Ice chips: Not tested   Thin Liquid Thin Liquid: Within functional limits Presentation: Cup;Self Fed;Straw    Nectar Thick Nectar Thick Liquid: Not tested   Honey Thick Honey Thick Liquid: Not tested   Puree Puree: Within functional limits Presentation: Self Fed;Spoon   Solid     Solid: Within functional limits Presentation: Self Fed      Leverette Read 02/10/2024,11:45 AM

## 2024-02-10 NOTE — Interval H&P Note (Signed)
 History and Physical Interval Note:  02/10/2024 7:12 AM  [name]  has presented today for surgery, with the diagnosis of melena, anemia.  The various methods of treatment have been discussed with the patient and family. After consideration of risks, benefits and other options for treatment, the patient has consented to  Procedure(s): EGD (ESOPHAGOGASTRODUODENOSCOPY) (N/A) as a surgical intervention.  The patient's history has been reviewed, patient examined, no change in status, stable for surgery.  I have reviewed the patient's chart and labs.  Questions were answered to the patient's satisfaction.     Elois Hair

## 2024-02-10 NOTE — Progress Notes (Signed)
@   2339 - Patient's BP 166/71. This is higher than earlier today when her systolic was in the 150's. Messaged Dr. Melvinia Stager and received order to give patient PO amlodipine.   @ 0056 - 5 mg PO amlodipine given.   @ 0353 - Patient's BP 173/91. Patient says her BP increases when she is anxious about tests. Patient ambulating frequently to restroom and BP elevated when she is getting up.   @ 0500 - BP cuff switched to smaller size and placed on forearm, due to the previous cuff being too tight d/t RUE edema from prior infiltrate in E.D.  Dr. Melvinia Stager states that patient's BP likely a result of anxiety as opposed to hypertension. Patient agrees.  @ [phone] - Dr. Melvinia Stager ordered PRN hydralazine.  @ [phone] -  Dr. Melvinia Stager stated via secure chat that she believes patient's HTN to be related to anxiety from upcoming test. She ordered ativan for anxiety, as she believes that will address the underlying cause.   @ [phone] - Endoscopy R.N. questioning why R.N. did not give IV hydralazine at 0400 when BP elevated. R.N. explained that IV hydralazine not ordered by MD until 0400 and patient pacing frequently in room, adding to elevated BP.

## 2024-02-10 NOTE — Plan of Care (Signed)

## 2024-02-10 NOTE — Progress Notes (Signed)
 OT Cancellation Note  Patient Details Name: [name] MRN: [id number] DOB: Mar 21, 1935   Cancelled Treatment:    Reason Eval/Treat Not Completed: Other (comment) (Pt about to get PRBC per nursing. Will follow up.) Erving Heather OTR/L  Acute Rehab Services  [phone] office number   Stevphen Elders 02/10/2024, 11:41 AM

## 2024-02-10 NOTE — Progress Notes (Signed)
 OT Cancellation Note  Patient Details Name: ANTONAE ZBIKOWSKI MRN: [id number] DOB: 09-Mar-1935   Cancelled Treatment:    Reason Eval/Treat Not Completed: Patient at procedure or test/ unavailable (Will follow up as soon as possible.) Trejon Duford K OTR/L  Acute Rehab Services  [phone] office number   Stevphen Elders 02/10/2024, 7:07 AM

## 2024-02-10 NOTE — Op Note (Signed)
 [facility] Patient Name: [name] Procedure Date : 02/10/2024 MRN: [id number] Attending MD: Jaye Mettle. Cherryl Corona , MD, [id number] Date of Birth: 03/30/1935 CSN: [id number] Age: 89 Admit Type: Inpatient Procedure:                Upper GI endoscopy Indications:              Iron deficiency anemia, positive FOBT Providers:                Geralyn Knee E. Cherryl Corona, MD, Nikki Barters RN, RN,                            Kimberly Penna Mbumina, Technician Referring MD:              Medicines:                Monitored Anesthesia Care Complications:            No immediate complications. Estimated Blood Loss:     Estimated blood loss was minimal. Procedure:                Pre-Anesthesia Assessment:                           - Prior to the procedure, a History and Physical                            was performed, and patient medications and                            allergies were reviewed. The patient's tolerance of                            previous anesthesia was also reviewed. The risks                            and benefits of the procedure and the sedation                            options and risks were discussed with the patient.                            All questions were answered, and informed consent                            was obtained. Prior Anticoagulants: The patient has                            taken no anticoagulant or antiplatelet agents. ASA                            Grade Assessment: II - A patient with mild systemic                            disease. After reviewing the risks and benefits,  the patient was deemed in satisfactory condition to                            undergo the procedure.                           After obtaining informed consent, the endoscope was                            passed under direct vision. Throughout the                            procedure, the patient's blood pressure, pulse, and                             oxygen saturations were monitored continuously. The                            [id number]-[id number] ([id number]) Olympus endoscope was introduced                            through the mouth, and advanced to the second part                            of duodenum. The upper GI endoscopy was                            accomplished without difficulty. The patient                            tolerated the procedure well. Scope In: Scope Out: Findings:      The examined portions of the nasopharynx, oropharynx and larynx were       normal.      The examined esophagus was normal.      The entire examined stomach was normal. Biopsies were taken with a cold       forceps for Helicobacter pylori testing. Estimated blood loss was       minimal.      The examined duodenum was normal. Impression:               - The examined portions of the nasopharynx,                            oropharynx and larynx were normal.                           - Normal esophagus.                           - Normal stomach. Biopsied.                           - Normal examined duodenum.                           - No endoscopic abnormalities to explain iron  deficiency anemia Moderate Sedation:      N/A Recommendation:           - Return patient to hospital ward for ongoing care.                           - Resume previous diet.                           - Continue present medications.                           - Await pathology results.                           - Colonoscopy has been recommended, but patient                            unequivocably declined a colonoscopy.                           - Can consider outpatient virtual colonoscopy to                            exclude colon cancer as cause of patient's IDA and                            postivie FOBT.                           - If patient not willing to undergo colonoscopy,                            patient can be discharged  home today from GI                            standpoint, given absence of overt bleeding and                            stable hgb following transfusion. Procedure Code(s):        --- Professional ---                           [phone], Esophagogastroduodenoscopy, flexible,                            transoral; with biopsy, single or multiple Diagnosis Code(s):        --- Professional ---                           D50.9, Iron  deficiency anemia, unspecified CPT copyright 2022 [employer]. All rights reserved. The codes documented in this report are preliminary and upon coder review may  be revised to meet current compliance requirements. Adelfa Lozito E. Cherryl Corona, MD 02/10/2024 7:54:13 AM This report has been signed electronically. Number of Addenda: 0

## 2024-02-10 NOTE — Progress Notes (Addendum)
 Patient's RN at the bedside reported that patient is very anxious for endoscopy and blood pressure is elevated due to anxiety. 1st giving Ativan 1mg  to see if it will help improve anxiety and in terms improve the blood pressure, however if does not in that case will give IV hydralazine as needed.  Nadav Swindell, MD [facility] 02/10/2024, 6:51 AM

## 2024-02-10 NOTE — Transfer of Care (Signed)
 Immediate Anesthesia Transfer of Care Note  Patient: [name]  Procedure(s) Performed: EGD (ESOPHAGOGASTRODUODENOSCOPY) BIOPSY, SKIN, SUBCUTANEOUS TISSUE, OR MUCOUS MEMBRANE  Patient Location: PACU  Anesthesia Type:MAC  Level of Consciousness: drowsy  Airway & Oxygen Therapy: Patient connected to face mask oxygen  Post-op Assessment: Report given to RN and Post -op Vital signs reviewed and stable  Post vital signs: Reviewed and stable  Last Vitals:  Vitals Value Taken Time  BP 115/54 02/10/24 0751  Temp    Pulse 78 02/10/24 0752  Resp 10 02/10/24 0752  SpO2 100 % 02/10/24 0752  Vitals shown include unfiled device data.  Last Pain:  Vitals:   02/10/24 0706  TempSrc: Tympanic  PainSc: 0-No pain         Complications: There were no known notable events for this encounter.

## 2024-02-10 NOTE — Anesthesia Postprocedure Evaluation (Signed)
 Anesthesia Post Note  Patient: [name]  Procedure(s) Performed: EGD (ESOPHAGOGASTRODUODENOSCOPY) BIOPSY, SKIN, SUBCUTANEOUS TISSUE, OR MUCOUS MEMBRANE     Patient location during evaluation: PACU Anesthesia Type: MAC Level of consciousness: awake and alert Pain management: pain level controlled Vital Signs Assessment: post-procedure vital signs reviewed and stable Respiratory status: spontaneous breathing, nonlabored ventilation, respiratory function stable and patient connected to nasal cannula oxygen Cardiovascular status: stable and blood pressure returned to baseline Postop Assessment: no apparent nausea or vomiting Anesthetic complications: no   There were no known notable events for this encounter.  Last Vitals:  Vitals:   02/10/24 1148 02/10/24 1213  BP: 111/89 (!) 158/82  Pulse: 80 72  Resp: 20 17  Temp: (!) 36.4 C (!) 36.3 C  SpO2: 99%     Last Pain:  Vitals:   02/10/24 1213  TempSrc: Oral  PainSc:                  Valente Gaskin Anaysha Andre

## 2024-02-10 NOTE — TOC Progression Note (Signed)
 Transition of Care [facility]) - Progression Note    Patient Details  Name: [name] MRN: [id number] Date of Birth: January 14, 1935  Transition of [facility]) CM/SW Contact  Eusebio High, RN Phone Number: 02/10/2024, 3:43 PM  Clinical Narrative:     Patient has been recommended [facility] for therapies at DC. [facility] has agreed to accept for PT/OT. NO DME has been recommended as of now  AVS has been updated  TOC will continue to follow patient for any additional discharge needs           Expected Discharge Plan and Services                                               Social Determinants of Health (SDOH) Interventions SDOH Screenings   Food Insecurity: No Food Insecurity (02/09/2024)  Housing: Low Risk  (02/09/2024)  Transportation Needs: No Transportation Needs (02/09/2024)  Utilities: Not At Risk (02/09/2024)  Social Connections: Moderately Isolated (02/09/2024)  Tobacco Use: Medium Risk (02/08/2024)    Readmission Risk Interventions     No data to display

## 2024-02-10 NOTE — Anesthesia Preprocedure Evaluation (Signed)
 Anesthesia Evaluation  Patient identified by MRN, date of birth, ID band Patient awake    Reviewed: Allergy & Precautions, NPO status , Patient's Chart, lab work & pertinent test results  Airway Mallampati: II  TM Distance: >3 FB Neck ROM: Full    Dental no notable dental hx.    Pulmonary neg pulmonary ROS, former smoker   Pulmonary exam normal        Cardiovascular hypertension,  Rhythm:Regular Rate:Normal     Neuro/Psych negative neurological ROS  negative psych ROS   GI/Hepatic Neg liver ROS,,,  Endo/Other  negative endocrine ROS    Renal/GU negative Renal ROS  negative genitourinary   Musculoskeletal negative musculoskeletal ROS (+)    Abdominal Normal abdominal exam  (+)   Peds  Hematology  (+) Blood dyscrasia, anemia Lab Results      Component                Value               Date                      WBC                      3.2 (L)             02/09/2024                HGB                      7.4 (L)             02/10/2024                HCT                      24.7 (L)            02/10/2024                MCV                      71.2 (L)            02/09/2024                PLT                      192                 02/09/2024              Anesthesia Other Findings   Reproductive/Obstetrics                             Anesthesia Physical Anesthesia Plan  ASA: 2  Anesthesia Plan: MAC   Post-op Pain Management:    Induction: Intravenous  PONV Risk Score and Plan: 2 and Propofol infusion and Treatment may vary due to age or medical condition  Airway Management Planned: Simple Face Mask and Nasal Cannula  Additional Equipment: None  Intra-op Plan:   Post-operative Plan:   Informed Consent: I have reviewed the patients History and Physical, chart, labs and discussed the procedure including the risks, benefits and alternatives for the proposed anesthesia with the  patient or authorized representative who has indicated his/her understanding and acceptance.     Dental  advisory given  Plan Discussed with: CRNA  Anesthesia Plan Comments:        Anesthesia Quick Evaluation

## 2024-02-10 NOTE — Progress Notes (Addendum)
 PROGRESS NOTE        PATIENT DETAILS Name: [name] Age: 89 y.o. Sex: female Date of Birth: 28-Dec-1934 Admit Date: 02/08/2024 Admitting Physician Bary Boss, DO ZOX:[id number], Sylvia Sims, MD  Brief Summary: Patient is a 89 y.o.  female with history of persistent atrial fibrillation not on anticoagulation, chronic HFpEF, pulmonary hypertension, mitral/tricuspid regurgitation-presented with black stools and a hemoglobin level of 6.7.  She was subsequently admitted to the hospitalist service.  Significant events: 5/20>> admit to [facility]  Significant studies: 5/20>> CT abdomen/pelvis: Question acute pancreatitis, colonic diverticulosis, moderate to large volume right pleural effusion.  Significant microbiology data: None  Procedures: 5/22>> EGD: No bleeding foci.  Consults: GI  Subjective: No bleeding events overnight-no BM in fact-does not want to pursue colonoscopy.  Objective: Vitals: Blood pressure 111/67, pulse 96, temperature (!) 97 F (36.1 C), temperature source Temporal, resp. rate (!) 21, height 5\' 8"  (1.727 m), weight 83.7 kg, SpO2 94%.   Exam: Gen Exam:Alert awake-not in any distress HEENT:atraumatic, normocephalic Chest: B/L clear to auscultation anteriorly CVS:S1S2 regular 3/6 systolic murmur Abdomen:soft non tender, non distended Extremities:++ edema Neurology: Non focal Skin: no rash  Pertinent Labs/Radiology:    Latest Ref Rng & Units 02/10/2024    5:09 AM 02/09/2024    9:24 PM 02/09/2024    4:14 PM  CBC  Hemoglobin 12.0 - 15.0 g/dL 7.4  7.6  7.7   Hematocrit 36.0 - 46.0 % 24.7  26.5  26.4     Lab Results  Component Value Date   NA 138 02/10/2024   K 3.5 02/10/2024   CL 104 02/10/2024   CO2 22 02/10/2024      Assessment/Plan: Presumed GI bleeding with acute blood loss anemia No further bleeding overnight Hb stable after 1 unit of PRBC transfusion EGD negative Given iron deficient status-colonoscopy  recommended-however patient adamant on not pursuing.  Understands that she could have underlying colon cancer-claims that she will think about it and pursue colonoscopy in the outpatient setting. Given her cardiac issues-will transfuse 1 additional unit of PRBC Will discuss with pharmacy regarding further IV iron infusion Follow CBC  Acute on chronic HFpEF Volume overloaded on exam Increase Lasix to 40 mg IV twice daily Continue metoprolol  Resume Avapro-bilateral lower dose Daily weights/strict  intake/output Follow  Right pleural effusion Presumably this is related to CHF-likely transudative However due to concern for iron deficiency anemia/possible colon cancer-exudative etiologies possible Discussed with patient-holding off on thoracocentesis-maximizing diuretics-to see if this improves pleural effusion.  If after several weeks of aggressive diuretics-pleural effusion persists-will need thoracocentesis.  Persistent atrial fibrillation Telemetry monitoring Not a candidate for long-term anticoagulation  History of tricuspid regurgitation History of pulmonary hypertension Stable for continued outpatient monitoring by cardiology  HLD Statin  CKD stage IIIa POA Creatinine close to baseline  HTN Holding amlodipine due to leg edema Continue metoprolol [name] Will slowly resume metoprolol /ARB over the next several days.  Mood disorder Zoloft  Code status:   Code Status: Full Code   DVT Prophylaxis: SCDs Start: 02/08/24 2212   Family Communication: Son at bedside   Disposition Plan: Status is: Inpatient Remains inpatient appropriate because: Severity of illness   Planned Discharge Destination:Home   Diet: Diet Order             Diet Heart Room service appropriate? Yes; Fluid consistency: Thin  Diet effective now  Antimicrobial agents: Anti-infectives (From admission, onward)    None        MEDICATIONS: Scheduled Meds:   sodium chloride   Intravenous Once   acetaminophen   650 mg Oral Once   amLODipine  5 mg Oral Q1200   diphenhydrAMINE  25 mg Oral Once   feeding supplement  1 Container Oral TID BM   furosemide  40 mg Intravenous Q12H   LORazepam  1 mg Oral STAT   metoprolol  succinate  50 mg Oral Q supper   pantoprazole (PROTONIX) IV  40 mg Intravenous Q12H   pravastatin  40 mg Oral Daily   sertraline  25 mg Oral QHS   Continuous Infusions: PRN Meds:.acetaminophen , hydrALAZINE, melatonin, polyethylene glycol, prochlorperazine   I have personally reviewed following labs and imaging studies  LABORATORY DATA: CBC: Recent Labs  Lab 02/08/24 1920 02/09/24 0609 02/09/24 1028 02/09/24 1614 02/09/24 2124 02/10/24 0509  WBC 3.8* 3.2*  --   --   --   --   NEUTROABS 2.3  --   --   --   --   --   HGB 6.5* 7.6* 7.5* 7.7* 7.6* 7.4*  HCT 23.0* 25.5* 25.9* 26.4* 26.5* 24.7*  MCV 76.4* 71.2*  --   --   --   --   PLT 222 192  --   --   --   --     Basic Metabolic Panel: Recent Labs  Lab 02/08/24 1920 02/09/24 0609 02/10/24 0509  NA 140 138 138  K 3.0* 2.9* 3.5  CL 102 103 104  CO2 27 25 22   GLUCOSE 122* 122* 116*  BUN 21 20 16   CREATININE 1.25* 1.21* 1.17*  CALCIUM 9.7 9.3 9.4  MG  --  1.7  --   PHOS  --  3.1  --     GFR: Estimated Creatinine Clearance: 39.1 mL/min (A) (by C-G formula based on SCr of 1.17 mg/dL (H)).  Liver Function Tests: Recent Labs  Lab 02/08/24 1920 02/09/24 0609 02/10/24 0509  AST 20 23 28   ALT 13 13 11   ALKPHOS 44 43 40  BILITOT 2.1* 2.2* 2.8*  PROT 6.0* 6.0* 5.6*  ALBUMIN 3.5 3.5 3.3*   No results for input(s): "LIPASE", "AMYLASE" in the last 168 hours. No results for input(s): "AMMONIA" in the last 168 hours.  Coagulation Profile: No results for input(s): "INR", "PROTIME" in the last 168 hours.  Cardiac Enzymes: No results for input(s): "CKTOTAL", "CKMB", "CKMBINDEX", "TROPONINI" in the last 168 hours.  BNP (last 3 results) Recent Labs     01/28/24 1006  PROBNP 3,706*    Lipid Profile: No results for input(s): "CHOL", "HDL", "LDLCALC", "TRIG", "CHOLHDL", "LDLDIRECT" in the last 72 hours.  Thyroid  Function Tests: No results for input(s): "TSH", "T4TOTAL", "FREET4", "T3FREE", "THYROIDAB" in the last 72 hours.  Anemia Panel: Recent Labs    02/09/24 0609  FERRITIN 9*  TIBC 528*  IRON 27*  RETICCTPCT 1.3    Urine analysis:    Component Value Date/Time   COLORURINE YELLOW 02/08/2024 2042   APPEARANCEUR CLEAR 02/08/2024 2042   LABSPEC 1.008 02/08/2024 2042   PHURINE 6.0 02/08/2024 2042   GLUCOSEU NEGATIVE 02/08/2024 2042   HGBUR NEGATIVE 02/08/2024 2042   BILIRUBINUR NEGATIVE 02/08/2024 2042   KETONESUR NEGATIVE 02/08/2024 2042   PROTEINUR NEGATIVE 02/08/2024 2042   UROBILINOGEN 1.0 11/22/2007 2152   NITRITE NEGATIVE 02/08/2024 2042   LEUKOCYTESUR MODERATE (A) 02/08/2024 2042    Sepsis Labs: Lactic Acid, Venous No results found  for: "LATICACIDVEN"  MICROBIOLOGY: No results found for this or any previous visit (from the past 240 hours).  RADIOLOGY STUDIES/RESULTS: CT ABDOMEN PELVIS WO CONTRAST Result Date: 02/09/2024 CLINICAL DATA:  Lower GI bleed Pt with symptomatic anemia, refusing colonoscopy EXAM: CT ABDOMEN AND PELVIS WITHOUT CONTRAST TECHNIQUE: Multidetector CT imaging of the abdomen and pelvis was performed following the standard protocol without IV contrast. RADIATION DOSE REDUCTION: This exam was performed according to the departmental dose-optimization program which includes automated exposure control, adjustment of the mA and/or kV according to patient size and/or use of iterative reconstruction technique. COMPARISON:  None Available. FINDINGS: Lower chest: Moderate to large volume right pleural effusion. Partial collapse of the right lower lobe. Trace small left pleural effusion. Atherosclerotic plaque. Hepatobiliary: No focal liver abnormality. Status post cholecystectomy. No biliary dilatation.  Pancreas: Diffusely atrophic. No focal lesion. Periappendiceal fat stranding. Trace free fluid. No main pancreatic ductal dilatation. Spleen: Normal in size without focal abnormality. Adrenals/Urinary Tract: No adrenal nodule bilaterally. No nephrolithiasis and no hydronephrosis. No definite contour-deforming renal mass. No ureterolithiasis or hydroureter. The urinary bladder is unremarkable. Stomach/Bowel: Stomach is within normal limits. No evidence of bowel wall thickening or dilatation. Colonic diverticulosis. Appendix appears normal. Vascular/Lymphatic: No abdominal aorta or iliac aneurysm. Severe atherosclerotic plaque of the aorta and its branches. No abdominal, pelvic, or inguinal lymphadenopathy. Reproductive: Status post hysterectomy. No adnexal masses. Other: Trace to small volume pelvic intraperitoneal free fluid. No intraperitoneal free gas. No organized fluid collection. Musculoskeletal: Trace fat containing left inguinal hernia. Diffuse subcutaneus soft tissue edema. No suspicious lytic or blastic osseous lesions. No acute displaced fracture. Grade 1 anterolisthesis of L4 on L5. Mild retrolisthesis of L5 on S1. IMPRESSION: 1. Question acute pancreatitis.  Correlate with lipase levels. 2. Trace to small volume free fluid ascites. 3. Colonic diverticulosis with no acute diverticulitis. 4. Moderate to large volume right pleural effusion. Partial collapse of the right lower lobe. 5. Trace small left pleural effusion. Electronically Signed   By: Morgane  Naveau M.D.   On: 02/09/2024 20:05   ECHOCARDIOGRAM COMPLETE Result Date: 02/09/2024    ECHOCARDIOGRAM REPORT   Patient Name:   [name] Date of Exam: 02/09/2024 Medical Rec #:  [id number]        Height:       68.0 in Accession #:    [id number]       Weight:       183.6 lb Date of Birth:  12/12/1934        BSA:          1.971 m Patient Age:    88 years         BP:           152/58 mmHg Patient Gender: F                HR:           77 bpm. Exam  Location:  Inpatient Procedure: 2D Echo, Cardiac Doppler and Color Doppler (Both Spectral and Color            Flow Doppler were utilized during procedure). Indications:    CHF  History:        Patient has no prior history of Echocardiogram examinations.                 Risk Factors:Hypertension.  Sonographer:    Janette Medley Referring Phys: CAROLE N HALL IMPRESSIONS  1. Left ventricular ejection fraction, by estimation, is 60 to 65%. The  left ventricle has normal function. The left ventricle has no regional wall motion abnormalities. Left ventricular diastolic function could not be evaluated.  2. Right ventricular systolic function is normal. The right ventricular size is moderately enlarged. There is severely elevated pulmonary artery systolic pressure.  3. Left atrial size was severely dilated.  4. Right atrial size was severely dilated.  5. The mitral valve is normal in structure. Trivial mitral valve regurgitation. No evidence of mitral stenosis. Moderate mitral annular calcification.  6. Tricuspid valve regurgitation is severe.  7. The aortic valve is tricuspid. There is moderate calcification of the aortic valve. There is moderate thickening of the aortic valve. Aortic valve regurgitation is trivial. Aortic valve sclerosis/calcification is present, without any evidence of aortic stenosis. Aortic valve area, by VTI measures 1.15 cm. Aortic valve mean gradient measures 9.0 mmHg. Aortic valve Vmax measures 2.07 m/s.  8. The inferior vena cava is normal in size with greater than 50% respiratory variability, suggesting right atrial pressure of 3 mmHg. FINDINGS  Left Ventricle: Left ventricular ejection fraction, by estimation, is 60 to 65%. The left ventricle has normal function. The left ventricle has no regional wall motion abnormalities. The left ventricular internal cavity size was normal in size. There is  no left ventricular hypertrophy. Left ventricular diastolic function could not be evaluated due to  atrial fibrillation. Left ventricular diastolic function could not be evaluated. Right Ventricle: The right ventricular size is moderately enlarged. No increase in right ventricular wall thickness. Right ventricular systolic function is normal. There is severely elevated pulmonary artery systolic pressure. The tricuspid regurgitant velocity is 3.57 m/s, and with an assumed right atrial pressure of 15 mmHg, the estimated right ventricular systolic pressure is 66.0 mmHg. Left Atrium: Left atrial size was severely dilated. Right Atrium: Right atrial size was severely dilated. Pericardium: There is no evidence of pericardial effusion. Mitral Valve: The mitral valve is normal in structure. Moderate mitral annular calcification. Trivial mitral valve regurgitation. No evidence of mitral valve stenosis. Tricuspid Valve: The tricuspid valve is normal in structure. Tricuspid valve regurgitation is severe. No evidence of tricuspid stenosis. Aortic Valve: The aortic valve is tricuspid. There is moderate calcification of the aortic valve. There is moderate thickening of the aortic valve. Aortic valve regurgitation is trivial. Aortic valve sclerosis/calcification is present, without any evidence of aortic stenosis. Aortic valve mean gradient measures 9.0 mmHg. Aortic valve peak gradient measures 17.1 mmHg. Aortic valve area, by VTI measures 1.15 cm. Pulmonic Valve: The pulmonic valve was normal in structure. Pulmonic valve regurgitation is not visualized. No evidence of pulmonic stenosis. Aorta: The aortic root is normal in size and structure. Venous: The inferior vena cava is normal in size with greater than 50% respiratory variability, suggesting right atrial pressure of 3 mmHg. IAS/Shunts: No atrial level shunt detected by color flow Doppler.  LEFT VENTRICLE PLAX 2D LVIDd:         3.40 cm   Diastology LVIDs:         2.20 cm   LV e' medial:  14.80 cm/s LV PW:         0.90 cm   LV e' lateral: 18.50 cm/s LV IVS:        1.20 cm  LVOT diam:     1.80 cm LV SV:         45 LV SV Index:   23 LVOT Area:     2.54 cm  RIGHT VENTRICLE  IVC RV S prime:     15.00 cm/s  IVC diam: 3.10 cm TAPSE (M-mode): 2.3 cm LEFT ATRIUM             Index        RIGHT ATRIUM           Index LA diam:        4.00 cm 2.03 cm/m   RA Area:     34.20 cm LA Vol (A2C):   81.3 ml 41.25 ml/m  RA Volume:   128.00 ml 64.94 ml/m LA Vol (A4C):   95.4 ml 48.40 ml/m LA Biplane Vol: 89.0 ml 45.16 ml/m  AORTIC VALVE AV Area (Vmax):    1.11 cm AV Area (Vmean):   1.14 cm AV Area (VTI):     1.15 cm AV Vmax:           207.00 cm/s AV Vmean:          138.500 cm/s AV VTI:            0.388 m AV Peak Grad:      17.1 mmHg AV Mean Grad:      9.0 mmHg LVOT Vmax:         90.17 cm/s LVOT Vmean:        62.300 cm/s LVOT VTI:          0.176 m LVOT/AV VTI ratio: 0.45  AORTA Ao Root diam: 2.60 cm Ao Asc diam:  3.50 cm TRICUSPID VALVE TR Peak grad:   51.0 mmHg TR Vmax:        357.00 cm/s  SHUNTS Systemic VTI:  0.18 m Systemic Diam: 1.80 cm Maudine Sos MD Electronically signed by Maudine Sos MD Signature Date/Time: 02/09/2024/2:28:07 PM    Final    DG CHEST PORT 1 VIEW Result Date: 02/09/2024 CLINICAL DATA:  Dyspnea EXAM: PORTABLE CHEST 1 VIEW COMPARISON:  12/27/2004 FINDINGS: Cardiac shadow is enlarged. Aortic calcifications are noted. Lungs are well aerated bilaterally. Right-sided pleural effusion is seen. No bony abnormality is noted. IMPRESSION: Right-sided pleural effusion. Electronically Signed   By: Violeta Grey M.D.   On: 02/09/2024 02:53     LOS: 2 days   Kimberly Penna, MD  [facility]    To contact the attending provider between 7A-7P or the covering provider during after hours 7P-7A, please log into the web site [email or web address] and access using universal [place] password for that web site. If you do not have the password, please call the hospital operator.  02/10/2024, 9:14 AM

## 2024-02-10 NOTE — Plan of Care (Signed)
  Problem: SLP Dysphagia Goals Goal: Misc Dysphagia Goal Flowsheets (Taken 02/10/2024 1137) Misc Dysphagia Goal: Pt will tolerate a regular consitency diet and thin liquids with no overt or subtle s/s of aspirtion or decline in pulmonary status.

## 2024-02-10 NOTE — Evaluation (Signed)
 Occupational Therapy Evaluation Patient Details Name: [name] MRN: [id number] DOB: 06-May-1935 Today's Date: 02/10/2024   History of Present Illness   89 y.o. female presents to [facility] 02/08/24 from PCP for low hemoglobin and dark stools. Received 3 units of blood. 5/22 EGD.PMHx:  persistent atrial fibrillation, history of GI bleed, thus not on anticoagulation, mitral regurgitation, tricuspid regurgitation, pulmonary hypertension     Clinical Impressions Pt is typically independent and drives. She lives in an ILF and receives one meal per day, housekeeping, landscaping and participates in activities with other residents at the ILF. Pt receiving second unit of blood, limiting mobility. Pt presents with generalized weakness. She is likely to progress well and not require post acute OT. Pt has a rollator and cane at home should she need it. Encouraged up to bathroom and to chair for meals with nursing staff. A colonoscopy has been recommended, pt has not decided if she wants to pursue.      If plan is discharge home, recommend the following:   Assistance with cooking/housework     Functional Status Assessment   Patient has had a recent decline in their functional status and demonstrates the ability to make significant improvements in function in a reasonable and predictable amount of time.     Equipment Recommendations   None recommended by OT     Recommendations for Other Services         Precautions/Restrictions   Precautions Precautions: Fall Restrictions Weight Bearing Restrictions Per Provider Order: No     Mobility Bed Mobility Overal bed mobility: Modified Independent                  Transfers Overall transfer level: Needs assistance Equipment used: None Transfers: Sit to/from Stand Sit to Stand: Contact guard assist                  Balance Overall balance assessment: Needs assistance, History of Falls Sitting-balance support: No  upper extremity supported, Feet supported Sitting balance-Leahy Scale: Good     Standing balance support: No upper extremity supported Standing balance-Leahy Scale: Fair                             ADL either performed or assessed with clinical judgement   ADL Overall ADL's : Needs assistance/impaired Eating/Feeding: Independent;Bed level   Grooming: Sitting;Set up   Upper Body Bathing: Set up;Sitting   Lower Body Bathing: Contact guard assist;Sit to/from stand   Upper Body Dressing : Set up;Sitting   Lower Body Dressing: Contact guard assist;Sit to/from stand                 General ADL Comments: limited eval due to pt receiving blood transfusion     Vision Baseline Vision/History: 1 Wears glasses Ability to See in Adequate Light: 0 Adequate Patient Visual Report: No change from baseline       Perception         Praxis         Pertinent Vitals/Pain Pain Assessment Pain Assessment: No/denies pain     Extremity/Trunk Assessment Upper Extremity Assessment Upper Extremity Assessment: Overall WFL for tasks assessed;Right hand dominant   Lower Extremity Assessment Lower Extremity Assessment: Defer to PT evaluation   Cervical / Trunk Assessment Cervical / Trunk Assessment: Normal   Communication Communication Communication: No apparent difficulties   Cognition Arousal: Alert Behavior During Therapy: WFL for tasks assessed/performed Cognition: No apparent impairments  Following commands: Intact       Cueing  General Comments   Cueing Techniques: Verbal cues      Exercises     Shoulder Instructions      Home Living Family/patient expects to be discharged to:: Private residence Living Arrangements: Alone Available Help at Discharge: Family;Available PRN/intermittently Type of Home: Independent living facility Home Access: Level entry     Home Layout: One level     Bathroom  Shower/Tub: Walk-in shower;Tub/shower unit   Bathroom Toilet: Handicapped height Bathroom Accessibility: Yes How Accessible: Accessible via walker Home Equipment: Rollator (4 wheels);Grab bars - toilet;Grab bars - tub/shower;Cane - single point;Wheelchair - manual          Prior Functioning/Environment Prior Level of Function : Independent/Modified Independent;Driving               ADLs Comments: ILF provides one meal per day, housekeeping and landscaping, likes to go with [employer] in the community    OT Problem List: Impaired balance (sitting and/or standing)   OT Treatment/Interventions: Self-care/ADL training;DME and/or AE instruction;Patient/family education;Balance training;Therapeutic activities      OT Goals(Current goals can be found in the care plan section)   Acute Rehab OT Goals OT Goal Formulation: With patient Time For Goal Achievement: 02/24/24 Potential to Achieve Goals: Good ADL Goals Pt Will Perform Grooming: with modified independence;standing Pt Will Perform Lower Body Bathing: with modified independence;sit to/from stand Pt Will Perform Lower Body Dressing: with modified independence;sit to/from stand Pt Will Transfer to Toilet: with modified independence;ambulating Pt Will Perform Toileting - Clothing Manipulation and hygiene: with modified independence;sit to/from stand Additional ADL Goal #1: Pt will gather items necessary for ADLs around her room mod I.   OT Frequency:  Min 1X/week    Co-evaluation              AM-PAC OT "6 Clicks" Daily Activity     Outcome Measure Help from another person eating meals?: None Help from another person taking care of personal grooming?: A Little Help from another person toileting, which includes using toliet, bedpan, or urinal?: A Little Help from another person bathing (including washing, rinsing, drying)?: A Little Help from another person to put on and taking off regular upper body  clothing?: None Help from another person to put on and taking off regular lower body clothing?: A Little 6 Click Score: 20   End of Session    Activity Tolerance: Treatment limited secondary to medical complications (Comment) (receiving second blood transfusion of the day) Patient left: in bed;with call bell/phone within reach  OT Visit Diagnosis: Unsteadiness on feet (R26.81)                Time: 1459-1530 OT Time Calculation (min): 31 min Charges:  OT General Charges $OT Visit: 1 Visit OT Evaluation $OT Eval Low Complexity: 1 Low OT Treatments $Self Care/Home Management : 8-22 mins  Avanell Leigh, OTR/L Acute Rehabilitation Services Office: [phone]   Jonette Nestle 02/10/2024, 3:45 PM

## 2024-02-11 DIAGNOSIS — D500 Iron deficiency anemia secondary to blood loss (chronic): Secondary | ICD-10-CM | POA: Diagnosis not present

## 2024-02-11 DIAGNOSIS — I10 Essential (primary) hypertension: Secondary | ICD-10-CM

## 2024-02-11 DIAGNOSIS — D649 Anemia, unspecified: Secondary | ICD-10-CM | POA: Diagnosis not present

## 2024-02-11 DIAGNOSIS — R195 Other fecal abnormalities: Secondary | ICD-10-CM | POA: Diagnosis not present

## 2024-02-11 LAB — COMPREHENSIVE METABOLIC PANEL WITH GFR
ALT: 15 U/L (ref 0–44)
AST: 24 U/L (ref 15–41)
Albumin: 3.3 g/dL — ABNORMAL LOW (ref 3.5–5.0)
Alkaline Phosphatase: 43 U/L (ref 38–126)
Anion gap: 10 (ref 5–15)
BUN: 15 mg/dL (ref 8–23)
CO2: 26 mmol/L (ref 22–32)
Calcium: 9.8 mg/dL (ref 8.9–10.3)
Chloride: 105 mmol/L (ref 98–111)
Creatinine, Ser: 1.19 mg/dL — ABNORMAL HIGH (ref 0.44–1.00)
GFR, Estimated: 45 mL/min — ABNORMAL LOW (ref >60)
Glucose, Bld: 109 mg/dL — ABNORMAL HIGH (ref 70–99)
Potassium: 3.3 mmol/L — ABNORMAL LOW (ref 3.5–5.1)
Sodium: 141 mmol/L (ref 135–145)
Total Bilirubin: 2.6 mg/dL — ABNORMAL HIGH (ref 0.0–1.2)
Total Protein: 5.8 g/dL — ABNORMAL LOW (ref 6.5–8.1)

## 2024-02-11 LAB — CBC
HCT: 29.3 % — ABNORMAL LOW (ref 36.0–46.0)
Hemoglobin: 8.8 g/dL — ABNORMAL LOW (ref 12.0–15.0)
MCH: 21.7 pg — ABNORMAL LOW (ref 26.0–34.0)
MCHC: 30 g/dL (ref 30.0–36.0)
MCV: 72.3 fL — ABNORMAL LOW (ref 80.0–100.0)
Platelets: 201 10*3/uL (ref 150–400)
RBC: 4.05 MIL/uL (ref 3.87–5.11)
RDW: 20 % — ABNORMAL HIGH (ref 11.5–15.5)
WBC: 5.3 10*3/uL (ref 4.0–10.5)
nRBC: 1.7 % — ABNORMAL HIGH (ref 0.0–0.2)

## 2024-02-11 LAB — SURGICAL PATHOLOGY

## 2024-02-11 LAB — TYPE AND SCREEN
ABO/RH(D): A POS
Antibody Screen: NEGATIVE
Unit division: 0
Unit division: 0

## 2024-02-11 LAB — BPAM RBC
Blood Product Expiration Date: 202506192359
Blood Product Expiration Date: 202506202359
ISSUE DATE / TIME: 202505210007
ISSUE DATE / TIME: 202505221134
Unit Type and Rh: 6200
Unit Type and Rh: 6200

## 2024-02-11 MED ORDER — POTASSIUM CHLORIDE CRYS ER 20 MEQ PO TBCR
40.0000 meq | EXTENDED_RELEASE_TABLET | Freq: Once | ORAL | Status: AC
  Administered 2024-02-11: 40 meq via ORAL
  Filled 2024-02-11: qty 2

## 2024-02-11 MED ORDER — SPIRONOLACTONE 25 MG PO TABS
25.0000 mg | ORAL_TABLET | Freq: Every day | ORAL | Status: DC
  Administered 2024-02-11: 25 mg via ORAL
  Filled 2024-02-11: qty 1

## 2024-02-11 MED ORDER — FERROUS SULFATE 325 (65 FE) MG PO TBEC: 325.0000 mg | DELAYED_RELEASE_TABLET | Freq: Two times a day (BID) | ORAL | 3 refills | Status: AC

## 2024-02-11 MED ORDER — FUROSEMIDE 10 MG/ML IJ SOLN
60.0000 mg | Freq: Two times a day (BID) | INTRAMUSCULAR | Status: DC
  Administered 2024-02-11: 60 mg via INTRAVENOUS
  Filled 2024-02-11: qty 6

## 2024-02-11 MED ORDER — SPIRONOLACTONE 25 MG PO TABS: 25.0000 mg | ORAL_TABLET | Freq: Every day | ORAL | 1 refills | Status: AC

## 2024-02-11 NOTE — Discharge Summary (Signed)
 PATIENT DETAILS Name: [name] Age: 89 y.o. Sex: female Date of Birth: 1934-12-29 MRN: [id number]. Admitting Physician: Bary Boss, DO JWJ:XBJYN, Marlou Sims, MD  Admit Date: 02/08/2024 Discharge date: 02/11/2024  Recommendations for Outpatient Follow-up:  Follow up with PCP in 1-2 weeks Please obtain CMP/CBC in one week Refused colonoscopy-reassess at next visit-if she concerns-please refer to GI Repeat two-view chest x-ray or CT chest in 3-4 weeks-if pleural effusion still persistent-may need thoracocentesis.  Admitted From:  Home  Disposition: Home Health   Discharge Condition: good  CODE STATUS:   Code Status: Full Code   Diet recommendation:  Diet Order             Diet - low sodium heart healthy           Diet Heart Room service appropriate? Yes; Fluid consistency: Thin  Diet effective now                    Brief Summary: Patient is a 89 y.o.  female with history of persistent atrial fibrillation not on anticoagulation, chronic HFpEF, pulmonary hypertension, mitral/tricuspid regurgitation-presented with black stools and a hemoglobin level of 6.7.  She was subsequently admitted to the hospitalist service.   Significant events: 5/20>> admit to [facility]   Significant studies: 5/20>> CT abdomen/pelvis: Question acute pancreatitis, colonic diverticulosis, moderate to large volume right pleural effusion.   Significant microbiology data: None   Procedures: 5/22>> EGD: No bleeding foci.   Consults: GI  Brief Hospital Course: Presumed lower GI bleeding with acute blood loss anemia No further bleeding-s/p EGD without any bleeding foci.   Iron deficient at baseline-adamantly refuses colonoscopy-and spite of being aware of the risk of colon cancer. Given a total of 2 units of PRBC-and 2 infusions of IV iron She will consider colonoscopy in the outpatient setting-PCP to reevaluate and refer to GI accordingly. Please follow CBC closely in the outpatient  setting.   Acute on chronic HFpEF Continues to have some lower extremity edema-but otherwise relatively stable-lying flat Adamant on being discharged today-will stop amlodipine-given lower extremity edema-she will continue on Demadex  60 mg daily-add Aldactone-follow-up with PCP (has an appointment this coming Tuesday)-where repeat electrolytes can be drawn. She will continue on metoprolol  and Avapro as previous.     Right pleural effusion Presumably this is related to CHF-likely transudative However due to concern for iron deficiency anemia/possible colon cancer-exudative etiologies possible Discussed with patient-holding off on thoracocentesis-maximizing diuretics-to see if this improves pleural effusion.  If after several weeks of aggressive diuretics-pleural effusion persists-will need thoracocentesis.   Persistent atrial fibrillation Telemetry monitoring Not a candidate for long-term anticoagulation   History of tricuspid regurgitation History of pulmonary hypertension Stable for continued outpatient monitoring by cardiology   HLD Statin   CKD stage IIIa POA Creatinine close to baseline   HTN Holding amlodipine due to leg edema Resume metoprolol /Avapro-added Aldactone.  Will continue on loop diuretic as previous.   Mood disorder Zoloft   Discharge Diagnoses:  Principal Problem:   Symptomatic anemia Active Problems:   Iron deficiency anemia due to chronic blood loss   Positive fecal occult blood test   Discharge Instructions:  Activity:  As tolerated   Discharge Instructions     (HEART FAILURE PATIENTS) Call MD:  Anytime you have any of the following symptoms: 1) 3 pound weight gain in 24 hours or 5 pounds in 1 week 2) shortness of breath, with or without a dry hacking cough 3) swelling in the hands,  feet or stomach 4) if you have to sleep on extra pillows at night in order to breathe.   Complete by: As directed    Call MD for:  difficulty breathing, headache or  visual disturbances   Complete by: As directed    Diet - low sodium heart healthy   Complete by: As directed    Discharge instructions   Complete by: As directed    Follow with Primary MD  [name], MD in 1-2 weeks  Please get a complete blood count and chemistry panel checked by your Primary MD at your next visit, and again as instructed by your Primary MD.  Get Medicines reviewed and adjusted: Please take all your medications with you for your next visit with your Primary MD  Laboratory/radiological data: Please request your Primary MD to go over all hospital tests and procedure/radiological results at the follow up, please ask your Primary MD to get all Hospital records sent to his/her office.  In some cases, they will be blood work, cultures and biopsy results pending at the time of your discharge. Please request that your primary care M.D. follows up on these results.  Also Note the following: If you experience worsening of your admission symptoms, develop shortness of breath, life threatening emergency, suicidal or homicidal thoughts you must seek medical attention immediately by calling 911 or calling your MD immediately  if symptoms less severe.  You must read complete instructions/literature along with all the possible adverse reactions/side effects for all the Medicines you take and that have been prescribed to you. Take any new Medicines after you have completely understood and accpet all the possible adverse reactions/side effects.   Do not drive when taking Pain medications or sleeping medications (Benzodaizepines)  Do not take more than prescribed Pain, Sleep and Anxiety Medications. It is not advisable to combine anxiety,sleep and pain medications without talking with your primary care practitioner  Special Instructions: If you have smoked or chewed Tobacco  in the last 2 yrs please stop smoking, stop any regular Alcohol  and or any Recreational drug use.  Wear Seat  belts while driving.  Please note: You were cared for by a hospitalist during your hospital stay. Once you are discharged, your primary care physician will handle any further medical issues. Please note that NO REFILLS for any discharge medications will be authorized once you are discharged, as it is imperative that you return to your primary care physician (or establish a relationship with a primary care physician if you do not have one) for your post hospital discharge needs so that they can reassess your need for medications and monitor your lab values.   Increase activity slowly   Complete by: As directed       Allergies as of 02/11/2024       Reactions   Shellfish Allergy Anaphylaxis, Rash   Iodinated Contrast Media Rash        Medication List     STOP taking these medications    amLODipine 5 MG tablet Commonly known as: NORVASC       TAKE these medications    cyanocobalamin 1000 MCG tablet Take 1,000 mcg by mouth daily.   ferrous sulfate 325 (65 FE) MG EC tablet Take 1 tablet (325 mg total) by mouth 2 (two) times daily.   irbesartan 300 MG tablet Commonly known as: AVAPRO Take 300 mg by mouth daily.   metoprolol  succinate 100 MG 24 hr tablet Commonly known as: TOPROL -XL TAKE ONE TABLET  BY MOUTH ONCE A DAY. TAKE WITH OR IMMEDIATELY FOLLOWING A MEAL What changed: See the new instructions.   MULTIVITAMIN ADULT PO Take by mouth daily.   omeprazole 40 MG capsule Commonly known as: PRILOSEC Take 40 mg by mouth daily.   OVER THE COUNTER MEDICATION Apply 1 application  topically as needed (Bil. knee pain; joint pain). RAWLEIGH'S SALVE  Apply to knees/joints daily after shower.   Potassium 99 MG Tabs Take by mouth daily.   pravastatin 80 MG tablet Commonly known as: PRAVACHOL Take 40 mg by mouth daily. Take one-half tablet by mouth daily.   sertraline 25 MG tablet Commonly known as: ZOLOFT Take 25 mg by mouth at bedtime.   spironolactone 25 MG  tablet Commonly known as: ALDACTONE Take 1 tablet (25 mg total) by mouth daily.   torsemide  20 MG tablet Commonly known as: DEMADEX  40 mg in the morning and 20 mg at night What changed:  how much to take how to take this when to take this additional instructions   Vitamin D3 25 MCG (1000 UT) Caps Take 1,000 Units by mouth daily.        Follow-up Information     [facility] Follow up.   Why: [facility] will contact you within  48 hours of DC to arrange a home health visit for physical and occupational therapy Contact information: [address] [place] , [place]  [address]  [phone]        [name], MD. Schedule an appointment as soon as possible for a visit in 1 week(s).   Specialty: Internal Medicine Contact information: [address] [place] [address] [phone]                Allergies  Allergen Reactions   Shellfish Allergy Anaphylaxis and Rash   Iodinated Contrast Media Rash     Other Procedures/Studies: CT ABDOMEN PELVIS WO CONTRAST Result Date: 02/09/2024 CLINICAL DATA:  Lower GI bleed Pt with symptomatic anemia, refusing colonoscopy EXAM: CT ABDOMEN AND PELVIS WITHOUT CONTRAST TECHNIQUE: Multidetector CT imaging of the abdomen and pelvis was performed following the standard protocol without IV contrast. RADIATION DOSE REDUCTION: This exam was performed according to the departmental dose-optimization program which includes automated exposure control, adjustment of the mA and/or kV according to patient size and/or use of iterative reconstruction technique. COMPARISON:  None Available. FINDINGS: Lower chest: Moderate to large volume right pleural effusion. Partial collapse of the right lower lobe. Trace small left pleural effusion. Atherosclerotic plaque. Hepatobiliary: No focal liver abnormality. Status post cholecystectomy. No biliary dilatation. Pancreas: Diffusely atrophic. No focal lesion. Periappendiceal fat  stranding. Trace free fluid. No main pancreatic ductal dilatation. Spleen: Normal in size without focal abnormality. Adrenals/Urinary Tract: No adrenal nodule bilaterally. No nephrolithiasis and no hydronephrosis. No definite contour-deforming renal mass. No ureterolithiasis or hydroureter. The urinary bladder is unremarkable. Stomach/Bowel: Stomach is within normal limits. No evidence of bowel wall thickening or dilatation. Colonic diverticulosis. Appendix appears normal. Vascular/Lymphatic: No abdominal aorta or iliac aneurysm. Severe atherosclerotic plaque of the aorta and its branches. No abdominal, pelvic, or inguinal lymphadenopathy. Reproductive: Status post hysterectomy. No adnexal masses. Other: Trace to small volume pelvic intraperitoneal free fluid. No intraperitoneal free gas. No organized fluid collection. Musculoskeletal: Trace fat containing left inguinal hernia. Diffuse subcutaneus soft tissue edema. No suspicious lytic or blastic osseous lesions. No acute displaced fracture. Grade 1 anterolisthesis of L4 on L5. Mild retrolisthesis of L5 on S1. IMPRESSION: 1. Question acute pancreatitis.  Correlate  with lipase levels. 2. Trace to small volume free fluid ascites. 3. Colonic diverticulosis with no acute diverticulitis. 4. Moderate to large volume right pleural effusion. Partial collapse of the right lower lobe. 5. Trace small left pleural effusion. Electronically Signed   By: Morgane  Naveau M.D.   On: 02/09/2024 20:05   ECHOCARDIOGRAM COMPLETE Result Date: 02/09/2024    ECHOCARDIOGRAM REPORT   Patient Name:   [name] Date of Exam: 02/09/2024 Medical Rec #:  [id number]        Height:       68.0 in Accession #:    [id number]       Weight:       183.6 lb Date of Birth:  Jan 20, 1935        BSA:          1.971 m Patient Age:    88 years         BP:           152/58 mmHg Patient Gender: F                HR:           77 bpm. Exam Location:  Inpatient Procedure: 2D Echo, Cardiac Doppler and Color  Doppler (Both Spectral and Color            Flow Doppler were utilized during procedure). Indications:    CHF  History:        Patient has no prior history of Echocardiogram examinations.                 Risk Factors:Hypertension.  Sonographer:    Janette Medley Referring Phys: CAROLE N HALL IMPRESSIONS  1. Left ventricular ejection fraction, by estimation, is 60 to 65%. The left ventricle has normal function. The left ventricle has no regional wall motion abnormalities. Left ventricular diastolic function could not be evaluated.  2. Right ventricular systolic function is normal. The right ventricular size is moderately enlarged. There is severely elevated pulmonary artery systolic pressure.  3. Left atrial size was severely dilated.  4. Right atrial size was severely dilated.  5. The mitral valve is normal in structure. Trivial mitral valve regurgitation. No evidence of mitral stenosis. Moderate mitral annular calcification.  6. Tricuspid valve regurgitation is severe.  7. The aortic valve is tricuspid. There is moderate calcification of the aortic valve. There is moderate thickening of the aortic valve. Aortic valve regurgitation is trivial. Aortic valve sclerosis/calcification is present, without any evidence of aortic stenosis. Aortic valve area, by VTI measures 1.15 cm. Aortic valve mean gradient measures 9.0 mmHg. Aortic valve Vmax measures 2.07 m/s.  8. The inferior vena cava is normal in size with greater than 50% respiratory variability, suggesting right atrial pressure of 3 mmHg. FINDINGS  Left Ventricle: Left ventricular ejection fraction, by estimation, is 60 to 65%. The left ventricle has normal function. The left ventricle has no regional wall motion abnormalities. The left ventricular internal cavity size was normal in size. There is  no left ventricular hypertrophy. Left ventricular diastolic function could not be evaluated due to atrial fibrillation. Left ventricular diastolic function could not be  evaluated. Right Ventricle: The right ventricular size is moderately enlarged. No increase in right ventricular wall thickness. Right ventricular systolic function is normal. There is severely elevated pulmonary artery systolic pressure. The tricuspid regurgitant velocity is 3.57 m/s, and with an assumed right atrial pressure of 15 mmHg, the estimated right ventricular systolic pressure is 66.0 mmHg. Left  Atrium: Left atrial size was severely dilated. Right Atrium: Right atrial size was severely dilated. Pericardium: There is no evidence of pericardial effusion. Mitral Valve: The mitral valve is normal in structure. Moderate mitral annular calcification. Trivial mitral valve regurgitation. No evidence of mitral valve stenosis. Tricuspid Valve: The tricuspid valve is normal in structure. Tricuspid valve regurgitation is severe. No evidence of tricuspid stenosis. Aortic Valve: The aortic valve is tricuspid. There is moderate calcification of the aortic valve. There is moderate thickening of the aortic valve. Aortic valve regurgitation is trivial. Aortic valve sclerosis/calcification is present, without any evidence of aortic stenosis. Aortic valve mean gradient measures 9.0 mmHg. Aortic valve peak gradient measures 17.1 mmHg. Aortic valve area, by VTI measures 1.15 cm. Pulmonic Valve: The pulmonic valve was normal in structure. Pulmonic valve regurgitation is not visualized. No evidence of pulmonic stenosis. Aorta: The aortic root is normal in size and structure. Venous: The inferior vena cava is normal in size with greater than 50% respiratory variability, suggesting right atrial pressure of 3 mmHg. IAS/Shunts: No atrial level shunt detected by color flow Doppler.  LEFT VENTRICLE PLAX 2D LVIDd:         3.40 cm   Diastology LVIDs:         2.20 cm   LV e' medial:  14.80 cm/s LV PW:         0.90 cm   LV e' lateral: 18.50 cm/s LV IVS:        1.20 cm LVOT diam:     1.80 cm LV SV:         45 LV SV Index:   23 LVOT Area:      2.54 cm  RIGHT VENTRICLE             IVC RV S prime:     15.00 cm/s  IVC diam: 3.10 cm TAPSE (M-mode): 2.3 cm LEFT ATRIUM             Index        RIGHT ATRIUM           Index LA diam:        4.00 cm 2.03 cm/m   RA Area:     34.20 cm LA Vol (A2C):   81.3 ml 41.25 ml/m  RA Volume:   128.00 ml 64.94 ml/m LA Vol (A4C):   95.4 ml 48.40 ml/m LA Biplane Vol: 89.0 ml 45.16 ml/m  AORTIC VALVE AV Area (Vmax):    1.11 cm AV Area (Vmean):   1.14 cm AV Area (VTI):     1.15 cm AV Vmax:           207.00 cm/s AV Vmean:          138.500 cm/s AV VTI:            0.388 m AV Peak Grad:      17.1 mmHg AV Mean Grad:      9.0 mmHg LVOT Vmax:         90.17 cm/s LVOT Vmean:        62.300 cm/s LVOT VTI:          0.176 m LVOT/AV VTI ratio: 0.45  AORTA Ao Root diam: 2.60 cm Ao Asc diam:  3.50 cm TRICUSPID VALVE TR Peak grad:   51.0 mmHg TR Vmax:        357.00 cm/s  SHUNTS Systemic VTI:  0.18 m Systemic Diam: 1.80 cm Maudine Sos MD Electronically signed by Maudine Sos MD Signature Date/Time:  02/09/2024/2:28:07 PM    Final    DG CHEST PORT 1 VIEW Result Date: 02/09/2024 CLINICAL DATA:  Dyspnea EXAM: PORTABLE CHEST 1 VIEW COMPARISON:  12/27/2004 FINDINGS: Cardiac shadow is enlarged. Aortic calcifications are noted. Lungs are well aerated bilaterally. Right-sided pleural effusion is seen. No bony abnormality is noted. IMPRESSION: Right-sided pleural effusion. Electronically Signed   By: Violeta Grey M.D.   On: 02/09/2024 02:53   DG Foot Complete Left Result Date: 02/02/2024 Please see detailed radiograph report in office note.  DG Foot Complete Right Result Date: 02/02/2024 Please see detailed radiograph report in office note.    TODAY-DAY OF DISCHARGE:  Subjective:   [name] today has no headache,no chest abdominal pain,no new weakness tingling or numbness, feels much better wants to go home today.  Objective:   Blood pressure 136/63, pulse 83, temperature 97.6 F (36.4 C), temperature source  Oral, resp. rate (!) 21, height 5\' 8"  (1.727 m), weight 83.7 kg, SpO2 95%.  Intake/Output Summary (Last 24 hours) at 02/11/2024 0905 Last data filed at 02/10/2024 1542 Gross per 24 hour  Intake 315 ml  Output --  Net 315 ml   Filed Weights   02/08/24 2201 02/10/24 0500  Weight: 83.3 kg 83.7 kg    Exam: Awake Alert, Oriented *3, No new F.N deficits, Normal affect [place].AT,PERRAL Supple Neck,No JVD, No cervical lymphadenopathy appriciated.  Symmetrical Chest wall movement, Good air movement bilaterally, CTAB RRR,No Gallops,Rubs or new Murmurs, No Parasternal Heave +ve B.Sounds, Abd Soft, Non tender, No organomegaly appriciated, No rebound -guarding or rigidity. No Cyanosis, Clubbing or edema, No new Rash or bruise   PERTINENT RADIOLOGIC STUDIES: CT ABDOMEN PELVIS WO CONTRAST Result Date: 02/09/2024 CLINICAL DATA:  Lower GI bleed Pt with symptomatic anemia, refusing colonoscopy EXAM: CT ABDOMEN AND PELVIS WITHOUT CONTRAST TECHNIQUE: Multidetector CT imaging of the abdomen and pelvis was performed following the standard protocol without IV contrast. RADIATION DOSE REDUCTION: This exam was performed according to the departmental dose-optimization program which includes automated exposure control, adjustment of the mA and/or kV according to patient size and/or use of iterative reconstruction technique. COMPARISON:  None Available. FINDINGS: Lower chest: Moderate to large volume right pleural effusion. Partial collapse of the right lower lobe. Trace small left pleural effusion. Atherosclerotic plaque. Hepatobiliary: No focal liver abnormality. Status post cholecystectomy. No biliary dilatation. Pancreas: Diffusely atrophic. No focal lesion. Periappendiceal fat stranding. Trace free fluid. No main pancreatic ductal dilatation. Spleen: Normal in size without focal abnormality. Adrenals/Urinary Tract: No adrenal nodule bilaterally. No nephrolithiasis and no hydronephrosis. No definite contour-deforming  renal mass. No ureterolithiasis or hydroureter. The urinary bladder is unremarkable. Stomach/Bowel: Stomach is within normal limits. No evidence of bowel wall thickening or dilatation. Colonic diverticulosis. Appendix appears normal. Vascular/Lymphatic: No abdominal aorta or iliac aneurysm. Severe atherosclerotic plaque of the aorta and its branches. No abdominal, pelvic, or inguinal lymphadenopathy. Reproductive: Status post hysterectomy. No adnexal masses. Other: Trace to small volume pelvic intraperitoneal free fluid. No intraperitoneal free gas. No organized fluid collection. Musculoskeletal: Trace fat containing left inguinal hernia. Diffuse subcutaneus soft tissue edema. No suspicious lytic or blastic osseous lesions. No acute displaced fracture. Grade 1 anterolisthesis of L4 on L5. Mild retrolisthesis of L5 on S1. IMPRESSION: 1. Question acute pancreatitis.  Correlate with lipase levels. 2. Trace to small volume free fluid ascites. 3. Colonic diverticulosis with no acute diverticulitis. 4. Moderate to large volume right pleural effusion. Partial collapse of the right lower lobe. 5. Trace small left pleural effusion. Electronically Signed  By: Morgane  Naveau M.D.   On: 02/09/2024 20:05   ECHOCARDIOGRAM COMPLETE Result Date: 02/09/2024    ECHOCARDIOGRAM REPORT   Patient Name:   [name] Date of Exam: 02/09/2024 Medical Rec #:  [id number]        Height:       68.0 in Accession #:    [id number]       Weight:       183.6 lb Date of Birth:  [birth year]        BSA:          1.971 m Patient Age:    88 years         BP:           152/58 mmHg Patient Gender: F                HR:           77 bpm. Exam Location:  Inpatient Procedure: 2D Echo, Cardiac Doppler and Color Doppler (Both Spectral and Color            Flow Doppler were utilized during procedure). Indications:    CHF  History:        Patient has no prior history of Echocardiogram examinations.                 Risk Factors:Hypertension.  Sonographer:     Janette Medley Referring Phys: CAROLE N HALL IMPRESSIONS  1. Left ventricular ejection fraction, by estimation, is 60 to 65%. The left ventricle has normal function. The left ventricle has no regional wall motion abnormalities. Left ventricular diastolic function could not be evaluated.  2. Right ventricular systolic function is normal. The right ventricular size is moderately enlarged. There is severely elevated pulmonary artery systolic pressure.  3. Left atrial size was severely dilated.  4. Right atrial size was severely dilated.  5. The mitral valve is normal in structure. Trivial mitral valve regurgitation. No evidence of mitral stenosis. Moderate mitral annular calcification.  6. Tricuspid valve regurgitation is severe.  7. The aortic valve is tricuspid. There is moderate calcification of the aortic valve. There is moderate thickening of the aortic valve. Aortic valve regurgitation is trivial. Aortic valve sclerosis/calcification is present, without any evidence of aortic stenosis. Aortic valve area, by VTI measures 1.15 cm. Aortic valve mean gradient measures 9.0 mmHg. Aortic valve Vmax measures 2.07 m/s.  8. The inferior vena cava is normal in size with greater than 50% respiratory variability, suggesting right atrial pressure of 3 mmHg. FINDINGS  Left Ventricle: Left ventricular ejection fraction, by estimation, is 60 to 65%. The left ventricle has normal function. The left ventricle has no regional wall motion abnormalities. The left ventricular internal cavity size was normal in size. There is  no left ventricular hypertrophy. Left ventricular diastolic function could not be evaluated due to atrial fibrillation. Left ventricular diastolic function could not be evaluated. Right Ventricle: The right ventricular size is moderately enlarged. No increase in right ventricular wall thickness. Right ventricular systolic function is normal. There is severely elevated pulmonary artery systolic pressure. The  tricuspid regurgitant velocity is 3.57 m/s, and with an assumed right atrial pressure of 15 mmHg, the estimated right ventricular systolic pressure is 66.0 mmHg. Left Atrium: Left atrial size was severely dilated. Right Atrium: Right atrial size was severely dilated. Pericardium: There is no evidence of pericardial effusion. Mitral Valve: The mitral valve is normal in structure. Moderate mitral annular calcification. Trivial mitral valve regurgitation. No evidence of  mitral valve stenosis. Tricuspid Valve: The tricuspid valve is normal in structure. Tricuspid valve regurgitation is severe. No evidence of tricuspid stenosis. Aortic Valve: The aortic valve is tricuspid. There is moderate calcification of the aortic valve. There is moderate thickening of the aortic valve. Aortic valve regurgitation is trivial. Aortic valve sclerosis/calcification is present, without any evidence of aortic stenosis. Aortic valve mean gradient measures 9.0 mmHg. Aortic valve peak gradient measures 17.1 mmHg. Aortic valve area, by VTI measures 1.15 cm. Pulmonic Valve: The pulmonic valve was normal in structure. Pulmonic valve regurgitation is not visualized. No evidence of pulmonic stenosis. Aorta: The aortic root is normal in size and structure. Venous: The inferior vena cava is normal in size with greater than 50% respiratory variability, suggesting right atrial pressure of 3 mmHg. IAS/Shunts: No atrial level shunt detected by color flow Doppler.  LEFT VENTRICLE PLAX 2D LVIDd:         3.40 cm   Diastology LVIDs:         2.20 cm   LV e' medial:  14.80 cm/s LV PW:         0.90 cm   LV e' lateral: 18.50 cm/s LV IVS:        1.20 cm LVOT diam:     1.80 cm LV SV:         45 LV SV Index:   23 LVOT Area:     2.54 cm  RIGHT VENTRICLE             IVC RV S prime:     15.00 cm/s  IVC diam: 3.10 cm TAPSE (M-mode): 2.3 cm LEFT ATRIUM             Index        RIGHT ATRIUM           Index LA diam:        4.00 cm 2.03 cm/m   RA Area:     34.20 cm LA  Vol (A2C):   81.3 ml 41.25 ml/m  RA Volume:   128.00 ml 64.94 ml/m LA Vol (A4C):   95.4 ml 48.40 ml/m LA Biplane Vol: 89.0 ml 45.16 ml/m  AORTIC VALVE AV Area (Vmax):    1.11 cm AV Area (Vmean):   1.14 cm AV Area (VTI):     1.15 cm AV Vmax:           207.00 cm/s AV Vmean:          138.500 cm/s AV VTI:            0.388 m AV Peak Grad:      17.1 mmHg AV Mean Grad:      9.0 mmHg LVOT Vmax:         90.17 cm/s LVOT Vmean:        62.300 cm/s LVOT VTI:          0.176 m LVOT/AV VTI ratio: 0.45  AORTA Ao Root diam: 2.60 cm Ao Asc diam:  3.50 cm TRICUSPID VALVE TR Peak grad:   51.0 mmHg TR Vmax:        357.00 cm/s  SHUNTS Systemic VTI:  0.18 m Systemic Diam: 1.80 cm Maudine Sos MD Electronically signed by Maudine Sos MD Signature Date/Time: 02/09/2024/2:28:07 PM    Final      PERTINENT LAB RESULTS: CBC: Recent Labs    02/09/24 0609 02/09/24 1028 02/10/24 2028 02/11/24 0449  WBC 3.2*  --   --  5.3  HGB 7.6*   < >  9.4* 8.8*  HCT 25.5*   < > 31.8* 29.3*  PLT 192  --   --  201   < > = values in this interval not displayed.   CMET CMP     Component Value Date/Time   NA 141 02/11/2024 0449   NA 142 01/28/2024 1006   K 3.3 (L) 02/11/2024 0449   CL 105 02/11/2024 0449   CO2 26 02/11/2024 0449   GLUCOSE 109 (H) 02/11/2024 0449   BUN 15 02/11/2024 0449   BUN 21 01/28/2024 1006   CREATININE 1.19 (H) 02/11/2024 0449   CALCIUM 9.8 02/11/2024 0449   PROT 5.8 (L) 02/11/2024 0449   ALBUMIN 3.3 (L) 02/11/2024 0449   AST 24 02/11/2024 0449   ALT 15 02/11/2024 0449   ALKPHOS 43 02/11/2024 0449   BILITOT 2.6 (H) 02/11/2024 0449   EGFR 46 (L) 01/28/2024 1006   GFRNONAA 45 (L) 02/11/2024 0449    GFR Estimated Creatinine Clearance: 38.5 mL/min (A) (by C-G formula based on SCr of 1.19 mg/dL (H)). Recent Labs    02/10/24 0907  LIPASE 22   No results for input(s): "CKTOTAL", "CKMB", "CKMBINDEX", "TROPONINI" in the last 72 hours. Invalid input(s): "POCBNP" No results for input(s):  "DDIMER" in the last 72 hours. No results for input(s): "HGBA1C" in the last 72 hours. No results for input(s): "CHOL", "HDL", "LDLCALC", "TRIG", "CHOLHDL", "LDLDIRECT" in the last 72 hours. No results for input(s): "TSH", "T4TOTAL", "T3FREE", "THYROIDAB" in the last 72 hours.  Invalid input(s): "FREET3" Recent Labs    02/09/24 0609  FERRITIN 9*  TIBC 528*  IRON 27*  RETICCTPCT 1.3   Coags: No results for input(s): "INR" in the last 72 hours.  Invalid input(s): "PT" Microbiology: No results found for this or any previous visit (from the past 240 hours).  FURTHER DISCHARGE INSTRUCTIONS:  Get Medicines reviewed and adjusted: Please take all your medications with you for your next visit with your Primary MD  Laboratory/radiological data: Please request your Primary MD to go over all hospital tests and procedure/radiological results at the follow up, please ask your Primary MD to get all Hospital records sent to his/her office.  In some cases, they will be blood work, cultures and biopsy results pending at the time of your discharge. Please request that your primary care M.D. goes through all the records of your hospital data and follows up on these results.  Also Note the following: If you experience worsening of your admission symptoms, develop shortness of breath, life threatening emergency, suicidal or homicidal thoughts you must seek medical attention immediately by calling 911 or calling your MD immediately  if symptoms less severe.  You must read complete instructions/literature along with all the possible adverse reactions/side effects for all the Medicines you take and that have been prescribed to you. Take any new Medicines after you have completely understood and accpet all the possible adverse reactions/side effects.   Do not drive when taking Pain medications or sleeping medications (Benzodaizepines)  Do not take more than prescribed Pain, Sleep and Anxiety Medications.  It is not advisable to combine anxiety,sleep and pain medications without talking with your primary care practitioner  Special Instructions: If you have smoked or chewed Tobacco  in the last 2 yrs please stop smoking, stop any regular Alcohol  and or any Recreational drug use.  Wear Seat belts while driving.  Please note: You were cared for by a hospitalist during your hospital stay. Once you are discharged, your primary care  physician will handle any further medical issues. Please note that NO REFILLS for any discharge medications will be authorized once you are discharged, as it is imperative that you return to your primary care physician (or establish a relationship with a primary care physician if you do not have one) for your post hospital discharge needs so that they can reassess your need for medications and monitor your lab values.  Total Time spent coordinating discharge including counseling, education and face to face time equals greater than 30 minutes.  Signed: Mahitha Hickling 02/11/2024 9:05 AM

## 2024-02-11 NOTE — Progress Notes (Signed)
 Physical Therapy Treatment Patient Details Name: [name] MRN: [id number] DOB: [birth year] Today's Date: 02/11/2024   History of Present Illness 89 y.o. female presents to [facility] 02/08/24 from PCP for low hemoglobin and dark stools. Received 3 units of blood. 5/22 EGD.PMHx:  persistent atrial fibrillation, history of GI bleed, thus not on anticoagulation, mitral regurgitation, tricuspid regurgitation, pulmonary hypertension.    PT Comments  Making solid progress towards functional goals. Pt ambulates 120 feet at supervision level without assistive device, showing mild instability with dynamic tasks. Agreeable to use SPC or RW at home and open to HHPT follow-up to further enhance strength, balance, gait and gross function with reduced fall risk. Patient will continue to benefit from skilled physical therapy services to further improve independence with functional mobility.     If plan is discharge home, recommend the following: Assist for transportation;Help with stairs or ramp for entrance   Can travel by private vehicle        Equipment Recommendations  None recommended by PT    Recommendations for Other Services       Precautions / Restrictions Precautions Precautions: Fall Restrictions Weight Bearing Restrictions Per Provider Order: No     Mobility  Bed Mobility Overal bed mobility: Modified Independent                  Transfers Overall transfer level: Needs assistance Equipment used: None Transfers: Sit to/from Stand Sit to Stand: Supervision           General transfer comment: Supervision for safety, good control no LOB.    Ambulation/Gait Ambulation/Gait assistance: Supervision Gait Distance (Feet): 120 Feet Assistive device: None Gait Pattern/deviations: Step-through pattern, Decreased stride length, Drifts right/left, Wide base of support Gait velocity: decr Gait velocity interpretation: 1.31 - 2.62 ft/sec, indicative of limited community  ambulator   General Gait Details: Minor drift from straight path when challenged with dynamic tasks but able to self correct and no overt LBO observed. Supervision for safety. Due to mild instability recommended use of SPC or rollator at home to maximize safety and reduce fall risk. Pt agreeable. Educated on findings, safety awareness.   Stairs             Wheelchair Mobility     Tilt Bed    Modified Rankin (Stroke Patients Only)       Balance Overall balance assessment: Needs assistance, History of Falls Sitting-balance support: No upper extremity supported, Feet supported Sitting balance-Leahy Scale: Good     Standing balance support: No upper extremity supported Standing balance-Leahy Scale: Fair Standing balance comment: able to stand statically with no AD                            Communication Communication Communication: No apparent difficulties  Cognition Arousal: Alert Behavior During Therapy: WFL for tasks assessed/performed   PT - Cognitive impairments: No apparent impairments                         Following commands: Intact      Cueing Cueing Techniques: Verbal cues  Exercises      General Comments General comments (skin integrity, edema, etc.): VSS      Pertinent Vitals/Pain Pain Assessment Pain Assessment: No/denies pain    Home Living                          Prior  Function            PT Goals (current goals can now be found in the care plan section) Acute Rehab PT Goals Patient Stated Goal: to gain confidence when walking PT Goal Formulation: With patient/family Time For Goal Achievement: 02/23/24 Potential to Achieve Goals: Good Progress towards PT goals: Progressing toward goals    Frequency    Min 1X/week      PT Plan      Co-evaluation              AM-PAC PT "6 Clicks" Mobility   Outcome Measure  Help needed turning from your back to your side while in a flat bed without  using bedrails?: None Help needed moving from lying on your back to sitting on the side of a flat bed without using bedrails?: None Help needed moving to and from a bed to a chair (including a wheelchair)?: A Little Help needed standing up from a chair using your arms (e.g., wheelchair or bedside chair)?: A Little Help needed to walk in hospital room?: A Little Help needed climbing 3-5 steps with a railing? : A Little 6 Click Score: 20    End of Session Equipment Utilized During Treatment: Gait belt Activity Tolerance: Patient tolerated treatment well Patient left: in bed;with call bell/phone within reach;with family/visitor present;with bed alarm set;with nursing/sitter in room Nurse Communication: Mobility status PT Visit Diagnosis: Unsteadiness on feet (R26.81);Other abnormalities of gait and mobility (R26.89);Muscle weakness (generalized) (M62.81);History of falling (Z91.81)     Time: 6045-4098 PT Time Calculation (min) (ACUTE ONLY): 14 min  Charges:    $Gait Training: 8-22 mins PT General Charges $$ ACUTE PT VISIT: 1 Visit                     Jory Ng, PT, DPT [facility]  Rehabilitation Services Physical Therapist Office: [phone] Website: [email or web address]    Alinda Irani 02/11/2024, 10:51 AM

## 2024-02-11 NOTE — Progress Notes (Signed)
 AVS completed and placed with chart.

## 2024-02-11 NOTE — Care Management Important Message (Signed)
 Important Message  Patient Details  Name: [name] MRN: [id number] Date of Birth: Oct 26, 1934   Important Message Given:  Yes - Medicare IM     Wynonia Hedges 02/11/2024, 4:04 PM

## 2024-02-11 NOTE — TOC Transition Note (Signed)
 Transition of Care [facility]) - Discharge Note   Patient Details  Name: [name] MRN: [id number] Date of Birth: 09-28-1934  Transition of Care [facility] CM/SW Contact:  Ronni Colace, RN Phone Number: 02/11/2024, 9:50 AM   Clinical Narrative:     Patient discharging today with Home health No other needs  Final next level of care: Home w Home Health Services Barriers to Discharge: No Barriers Identified   Patient Goals and CMS Choice            Discharge Placement  Home with home health          Discharge Plan and Services Additional resources added to the After Visit Summary for                                       Social Drivers of Health (SDOH) Interventions SDOH Screenings   Food Insecurity: No Food Insecurity (02/09/2024)  Housing: Low Risk  (02/09/2024)  Transportation Needs: No Transportation Needs (02/09/2024)  Utilities: Not At Risk (02/09/2024)  Social Connections: Moderately Isolated (02/09/2024)  Tobacco Use: Medium Risk (02/08/2024)     Readmission Risk Interventions     No data to display

## 2024-02-11 NOTE — Telephone Encounter (Signed)
 Pt has an echo on 6/11 and then sees you. Are you okay with canceling echo and then just seeing you?

## 2024-02-11 NOTE — Telephone Encounter (Signed)
 Sorry to hear than she is the hospital. Okay to cancel echocardiogram.  Thanks MJP

## 2024-02-13 ENCOUNTER — Encounter (HOSPITAL_COMMUNITY): Payer: Self-pay | Admitting: Gastroenterology

## 2024-02-13 ENCOUNTER — Ambulatory Visit: Payer: Self-pay | Admitting: Gastroenterology

## 2024-02-13 NOTE — Progress Notes (Signed)
 [name],  The biopsies taken from your stomach were notable for mild reactive gastropathy which is a very common finding and often related to use of certain medications (usually NSAIDs), but there was no evidence of Helicobacter pylori infection. This common finding is not felt to necessarily be a cause of iron deficiency.  Please let us  what you decide regarding a colonoscopy, virtual colonoscopy, or if you decline further evaluation of your iron deficiency anemia.

## 2024-02-15 DIAGNOSIS — Z09 Encounter for follow-up examination after completed treatment for conditions other than malignant neoplasm: Secondary | ICD-10-CM | POA: Diagnosis not present

## 2024-02-15 DIAGNOSIS — R600 Localized edema: Secondary | ICD-10-CM | POA: Diagnosis not present

## 2024-02-15 DIAGNOSIS — J90 Pleural effusion, not elsewhere classified: Secondary | ICD-10-CM | POA: Diagnosis not present

## 2024-02-15 DIAGNOSIS — I5033 Acute on chronic diastolic (congestive) heart failure: Secondary | ICD-10-CM | POA: Diagnosis not present

## 2024-02-15 DIAGNOSIS — R2232 Localized swelling, mass and lump, left upper limb: Secondary | ICD-10-CM | POA: Diagnosis not present

## 2024-02-15 DIAGNOSIS — K922 Gastrointestinal hemorrhage, unspecified: Secondary | ICD-10-CM | POA: Diagnosis not present

## 2024-02-15 DIAGNOSIS — E611 Iron deficiency: Secondary | ICD-10-CM | POA: Diagnosis not present

## 2024-02-16 DIAGNOSIS — R2232 Localized swelling, mass and lump, left upper limb: Secondary | ICD-10-CM | POA: Diagnosis not present

## 2024-02-19 DIAGNOSIS — M6281 Muscle weakness (generalized): Secondary | ICD-10-CM | POA: Diagnosis not present

## 2024-02-21 ENCOUNTER — Telehealth: Payer: Self-pay | Admitting: Gastroenterology

## 2024-02-21 DIAGNOSIS — M6281 Muscle weakness (generalized): Secondary | ICD-10-CM | POA: Diagnosis not present

## 2024-02-21 NOTE — Telephone Encounter (Signed)
 Patient calls indicating that she was previously told in her 26s that she did not need any further colonoscopy procedures and really does not want to go through prep ect at this point. Explained to patient that though we advise patients at a certain age they no longer need procedures, if any acute issues come up that need evaluation, those are handled on an individual basis. Patient's acute issue is IDA. Endo did not show reason for IDA so evaluation of colon is the next step in work up. Discussed traditional colonoscopy vs CT virtual colonoscopy with patient and also gave her the option to do nothing at this time for additional work up. Patient states that her sons really want her to have the procedure but she had a friend to pass away due to colon perforation following colonoscopy, so again,nervous to proceed with testing. Patient says she is going to think about options further, discuss with her PCP and her children and will reach back out with her decision next week.

## 2024-02-21 NOTE — Telephone Encounter (Signed)
 Inbound call from patient requesting a call to discuss colonoscopy further. Patient states she does not wish to proceed with colonoscopy and would like to discuss other alternatives. Patient states she will be unavailable between 11:30-1:30. Please advise, thank you.

## 2024-02-22 DIAGNOSIS — K922 Gastrointestinal hemorrhage, unspecified: Secondary | ICD-10-CM | POA: Diagnosis not present

## 2024-02-22 DIAGNOSIS — I808 Phlebitis and thrombophlebitis of other sites: Secondary | ICD-10-CM | POA: Diagnosis not present

## 2024-02-22 DIAGNOSIS — I5033 Acute on chronic diastolic (congestive) heart failure: Secondary | ICD-10-CM | POA: Diagnosis not present

## 2024-02-22 DIAGNOSIS — J90 Pleural effusion, not elsewhere classified: Secondary | ICD-10-CM | POA: Diagnosis not present

## 2024-02-22 DIAGNOSIS — R600 Localized edema: Secondary | ICD-10-CM | POA: Diagnosis not present

## 2024-02-22 DIAGNOSIS — E611 Iron deficiency: Secondary | ICD-10-CM | POA: Diagnosis not present

## 2024-02-22 NOTE — Telephone Encounter (Signed)
 Patient returning phone call to discuss decision of colonoscopy further. States she will be available after 1:00 pm. Please advise, thank you.

## 2024-02-22 NOTE — Telephone Encounter (Signed)
 Patient says that she will move forward with optical colonoscopy for further evaluation of IDA. Patient denies use of any anticoagulants (though she states she somewhat took herself off these last year and her cardiologist knows she is no longer taking). Patient denies any supplemental O2 use. EF 60-65%. Patient does have an upcoming referral to pulmonology for evaluation of moderate to large volume right pleural effusion with partial collapse of right lower lobe.   Is patient okay to have colonoscopy in [facility]?

## 2024-02-23 NOTE — Telephone Encounter (Signed)
 Spoke to patient to advise that it is likely okay for her to have colonoscopy in our [facility] outpatient setting. However, since she has had some recent pulmonary issues (pleural effusion, partial collapse RLL of lung), we would like pulmonology approval for her to move forward with our procedure. Patient was unaware that a referral was placed by her PCP for pulmonary evaluation. Patient has been advised that she should go to appointment when scheduled and we will follow up after that. She verbalizes understanding.

## 2024-02-24 ENCOUNTER — Other Ambulatory Visit: Payer: Self-pay | Admitting: Cardiology

## 2024-02-24 DIAGNOSIS — M6281 Muscle weakness (generalized): Secondary | ICD-10-CM | POA: Diagnosis not present

## 2024-02-28 DIAGNOSIS — M6281 Muscle weakness (generalized): Secondary | ICD-10-CM | POA: Diagnosis not present

## 2024-02-29 NOTE — Progress Notes (Unsigned)
 Cardiology Office Note:  .   Date:  02/29/2024  ID:  NICCI VAUGHAN, DOB November 03, 1934, MRN [id number] PCP: [name], MD  [facility] Providers Cardiologist:  Fransico Ivy, MD PCP: [name], MD  No chief complaint on file.     History of Present Illness: .    EDDITH MENTOR is a 89 y.o. female with hypertension, hyperlipidemia, persistent atrial fibrillation, h/o GI bleed-thus not on anticoagulation, MR, TR, pulmonary hypertension  ***    There were no vitals filed for this visit.     ROS:  Review of Systems  Cardiovascular:  Positive for dyspnea on exertion and leg swelling. Negative for chest pain, palpitations and syncope.     Studies Reviewed: Aaron Aas        EKG 01/28/2024: Atrial fibrillation 67 bpm Low voltage QRS Septal infarct , age undetermined When compared with ECG of 05-Feb-2018 17:39, Atrial fibrillation has replaced Sinus rhythm Septal infarct is now Present Nonspecific T wave abnormality now evident in Anterior leads  Labs 01/2024: Hb 8.8 Cr 1.19, eGFR 45, K 3.3 Total protein 5.8, T.bilirubin 2.6 BNP 521  12/2022: Chol 129, TG 72, HDL 56, LDL 58 Cr 1.0 TSH 4.9  Echocardiogram 01/2024:  1. Left ventricular ejection fraction, by estimation, is 60 to 65%. The  left ventricle has normal function. The left ventricle has no regional  wall motion abnormalities. Left ventricular diastolic function could not  be evaluated.   2. Right ventricular systolic function is normal. The right ventricular  size is moderately enlarged. There is severely elevated pulmonary artery  systolic pressure with estimated right ventricular systolic pressure is 66.0 mmHg.   3. Left atrial size was severely dilated.   4. Right atrial size was severely dilated.   5. The mitral valve is normal in structure. Trivial mitral valve  regurgitation. No evidence of mitral stenosis. Moderate mitral annular  calcification.   6. Tricuspid valve regurgitation is  severe.   7. The aortic valve is tricuspid. There is moderate calcification of the  aortic valve. There is moderate thickening of the aortic valve. Aortic  valve regurgitation is trivial. Aortic valve sclerosis/calcification is  present, without any evidence of  aortic stenosis. Aortic valve area, by VTI measures 1.15 cm. Aortic valve  mean gradient measures 9.0 mmHg. Aortic valve Vmax measures 2.07 m/s.   8. The inferior vena cava is normal in size with greater than 50%  respiratory variability, suggesting right atrial pressure of 3 mmHg.    Risk Assessment/Calculations:    CHA2DS2-VASc Score = 4  This indicates a 4.8% annual risk of stroke. The patient's score is based upon: CHF History: 0 HTN History: 1 Diabetes History: 0 Stroke History: 0 Vascular Disease History: 0 Age Score: 2 Gender Score: 1    Physical Exam:   Physical Exam Vitals and nursing note reviewed.  Constitutional:      General: She is not in acute distress. Neck:     Vascular: No JVD.  Cardiovascular:     Rate and Rhythm: Normal rate. Rhythm irregular.     Heart sounds: Normal heart sounds. No murmur heard. Pulmonary:     Effort: Pulmonary effort is normal.     Breath sounds: Normal breath sounds. No wheezing or rales.  Musculoskeletal:     Right lower leg: Edema (2+) present.     Left lower leg: Edema (2+) present.      VISIT DIAGNOSES: No diagnosis found.     ASSESSMENT AND PLAN: .  IVET GUERRIERI is a 89 y.o. female with hypertension, hyperlipidemia, persistent atrial fibrillation, h/o GI bleed-thus not on anticoagulation, MR, TR, pulmonary hypertension  Severe TR, pulmonary hypertension: Symptomatic with leg edema. *** *** torsemide  from 10 mg twice daily to 40 mg in AM.  20 mg in p.m. Check BMP, proBNP today, and repeat in 2 weeks. Echocardiogram and same-day appointment-4 weeks. If no improvement in symptoms with outpatient mobility, she may need hospitalization for IV  diuresis.  Persistent atrial fibrillation: Rate controlled. Not on Xarelto  since May 2024 due to GI bleeding.  No recurrence of GI bleeding. She understands risks of stroke, but is , but she remains reluctant to try anticoagulation, or consider left atrial Penders closure.    F/u in 3-4 weeks  Signed, Cody Das, MD

## 2024-03-01 ENCOUNTER — Ambulatory Visit: Attending: Cardiology | Admitting: Cardiology

## 2024-03-01 ENCOUNTER — Other Ambulatory Visit (HOSPITAL_COMMUNITY)

## 2024-03-01 ENCOUNTER — Encounter: Payer: Self-pay | Admitting: Cardiology

## 2024-03-01 VITALS — BP 170/90 | HR 76 | Ht 68.0 in | Wt 156.4 lb

## 2024-03-01 DIAGNOSIS — D500 Iron deficiency anemia secondary to blood loss (chronic): Secondary | ICD-10-CM | POA: Diagnosis not present

## 2024-03-01 DIAGNOSIS — I4819 Other persistent atrial fibrillation: Secondary | ICD-10-CM

## 2024-03-01 DIAGNOSIS — I2729 Other secondary pulmonary hypertension: Secondary | ICD-10-CM | POA: Diagnosis not present

## 2024-03-01 NOTE — Patient Instructions (Signed)
 Lab Work: CBC BMP PROBNP   Follow-Up: At [facility], you and your health needs are our priority.  As part of our continuing mission to provide you with exceptional heart care, our providers are all part of one team.  This team includes your primary Cardiologist (physician) and Advanced Practice Providers or APPs (Physician Assistants and Nurse Practitioners) who all work together to provide you with the care you need, when you need it.  Your next appointment:   3 month(s)  Provider:   Cody Das, MD

## 2024-03-02 DIAGNOSIS — M6281 Muscle weakness (generalized): Secondary | ICD-10-CM | POA: Diagnosis not present

## 2024-03-02 LAB — CBC
Hematocrit: 39.4 % (ref 34.0–46.6)
Hemoglobin: 11.9 g/dL (ref 11.1–15.9)
MCH: 24.7 pg — ABNORMAL LOW (ref 26.6–33.0)
MCHC: 30.2 g/dL — ABNORMAL LOW (ref 31.5–35.7)
MCV: 82 fL (ref 79–97)
Platelets: 331 10*3/uL (ref 150–450)
RBC: 4.81 x10E6/uL (ref 3.77–5.28)
RDW: 26.9 % — ABNORMAL HIGH (ref 11.7–15.4)
WBC: 4.3 10*3/uL (ref 3.4–10.8)

## 2024-03-02 LAB — BASIC METABOLIC PANEL WITH GFR
BUN/Creatinine Ratio: 23 (ref 12–28)
BUN: 26 mg/dL (ref 8–27)
CO2: 23 mmol/L (ref 20–29)
Calcium: 10.5 mg/dL — ABNORMAL HIGH (ref 8.7–10.3)
Chloride: 98 mmol/L (ref 96–106)
Creatinine, Ser: 1.13 mg/dL — ABNORMAL HIGH (ref 0.57–1.00)
Glucose: 88 mg/dL (ref 70–99)
Potassium: 4.5 mmol/L (ref 3.5–5.2)
Sodium: 141 mmol/L (ref 134–144)
eGFR: 47 mL/min/{1.73_m2} — ABNORMAL LOW (ref >59)

## 2024-03-02 LAB — PRO B NATRIURETIC PEPTIDE: NT-Pro BNP: 2960 pg/mL — ABNORMAL HIGH (ref 0–738)

## 2024-03-03 ENCOUNTER — Ambulatory Visit: Payer: Self-pay | Admitting: Cardiology

## 2024-03-09 DIAGNOSIS — M6281 Muscle weakness (generalized): Secondary | ICD-10-CM | POA: Diagnosis not present

## 2024-03-13 DIAGNOSIS — M6281 Muscle weakness (generalized): Secondary | ICD-10-CM | POA: Diagnosis not present

## 2024-03-14 DIAGNOSIS — I4891 Unspecified atrial fibrillation: Secondary | ICD-10-CM | POA: Diagnosis not present

## 2024-03-14 DIAGNOSIS — E785 Hyperlipidemia, unspecified: Secondary | ICD-10-CM | POA: Diagnosis not present

## 2024-03-14 DIAGNOSIS — D500 Iron deficiency anemia secondary to blood loss (chronic): Secondary | ICD-10-CM | POA: Diagnosis not present

## 2024-03-14 DIAGNOSIS — J90 Pleural effusion, not elsewhere classified: Secondary | ICD-10-CM | POA: Diagnosis not present

## 2024-03-14 DIAGNOSIS — E559 Vitamin D deficiency, unspecified: Secondary | ICD-10-CM | POA: Diagnosis not present

## 2024-03-14 DIAGNOSIS — I10 Essential (primary) hypertension: Secondary | ICD-10-CM | POA: Diagnosis not present

## 2024-03-14 DIAGNOSIS — E038 Other specified hypothyroidism: Secondary | ICD-10-CM | POA: Diagnosis not present

## 2024-03-14 DIAGNOSIS — Z0000 Encounter for general adult medical examination without abnormal findings: Secondary | ICD-10-CM | POA: Diagnosis not present

## 2024-03-17 DIAGNOSIS — M6281 Muscle weakness (generalized): Secondary | ICD-10-CM | POA: Diagnosis not present

## 2024-03-20 DIAGNOSIS — M6281 Muscle weakness (generalized): Secondary | ICD-10-CM | POA: Diagnosis not present

## 2024-03-21 DIAGNOSIS — D225 Melanocytic nevi of trunk: Secondary | ICD-10-CM | POA: Diagnosis not present

## 2024-03-21 DIAGNOSIS — L570 Actinic keratosis: Secondary | ICD-10-CM | POA: Diagnosis not present

## 2024-03-21 DIAGNOSIS — L821 Other seborrheic keratosis: Secondary | ICD-10-CM | POA: Diagnosis not present

## 2024-03-21 DIAGNOSIS — L814 Other melanin hyperpigmentation: Secondary | ICD-10-CM | POA: Diagnosis not present

## 2024-03-30 DIAGNOSIS — M6281 Muscle weakness (generalized): Secondary | ICD-10-CM | POA: Diagnosis not present

## 2024-04-04 DIAGNOSIS — M6281 Muscle weakness (generalized): Secondary | ICD-10-CM | POA: Diagnosis not present

## 2024-04-06 DIAGNOSIS — M6281 Muscle weakness (generalized): Secondary | ICD-10-CM | POA: Diagnosis not present

## 2024-04-11 DIAGNOSIS — M6281 Muscle weakness (generalized): Secondary | ICD-10-CM | POA: Diagnosis not present

## 2024-04-13 DIAGNOSIS — M6281 Muscle weakness (generalized): Secondary | ICD-10-CM | POA: Diagnosis not present

## 2024-04-18 DIAGNOSIS — M6281 Muscle weakness (generalized): Secondary | ICD-10-CM | POA: Diagnosis not present

## 2024-04-20 DIAGNOSIS — M6281 Muscle weakness (generalized): Secondary | ICD-10-CM | POA: Diagnosis not present

## 2024-04-28 ENCOUNTER — Encounter: Payer: Self-pay | Admitting: Student in an Organized Health Care Education/Training Program

## 2024-04-28 ENCOUNTER — Ambulatory Visit
Admission: RE | Admit: 2024-04-28 | Discharge: 2024-04-28 | Disposition: A | Discharge status: Home / Self Care | Source: Ambulatory Visit | Attending: Student in an Organized Health Care Education/Training Program | Admitting: Student in an Organized Health Care Education/Training Program

## 2024-04-28 ENCOUNTER — Ambulatory Visit: Admitting: Student in an Organized Health Care Education/Training Program

## 2024-04-28 VITALS — BP 104/72 | HR 62 | Temp 97.6°F | Ht 68.0 in | Wt 164.2 lb

## 2024-04-28 DIAGNOSIS — J90 Pleural effusion, not elsewhere classified: Secondary | ICD-10-CM

## 2024-04-28 DIAGNOSIS — Z01811 Encounter for preprocedural respiratory examination: Secondary | ICD-10-CM | POA: Diagnosis not present

## 2024-04-28 NOTE — Progress Notes (Signed)
 Assessment & Plan:   #Pleural effusion (Primary)  Presents for the evaluation of right-sided pleural effusion in the setting of decompensated heart failure.  The effusion was noted on the CT scan.  Today, I performed point-of-care ultrasound at the bedside and evaluated both the right and left sided pleural spaces.  I do not see any pleural effusions on the right side or the left side.  This was most likely a result of decompensated heart failure and has improved with IV diuresis while in the hospital.  I will obtain a chest x-ray today to further document clearance.  - DG Chest 2 View > reviewed, effusion is resolved  #Preoperative risk assessment  Presents for preoperative clearance/assessment prior to colonoscopy.  Scores 0.8% risk of post operative pulmonary complication with the [facility] pulmonary risk score.  Score 16 points and is low risk for postoperative pulmonary complications with ARISCAT.  This is an overall low risk procedure with conscious sedation and the patient is optimized from a pulmonary perspective.  That said, most of her risk would be cardiac in nature given her underlying A-fib and heart failure.  Would recommend evaluation with cardiology prior to proceeding.   Return if symptoms worsen or fail to improve.  I spent 60 minutes caring for this patient today, including preparing to see the patient, obtaining a medical history , reviewing a separately obtained history, performing a medically appropriate examination and/or evaluation, counseling and educating the patient/family/caregiver, ordering medications, tests, or procedures, documenting clinical information in the electronic health record, and independently interpreting results (not separately reported/billed) and communicating results to the patient/family/caregiver  Belva November, MD [facility]  End of visit medications:  No orders of the defined types were placed in this  encounter.    Current Outpatient Medications:    Cholecalciferol (VITAMIN D3) 25 MCG (1000 UT) CAPS, Take 1,000 Units by mouth daily., Disp: , Rfl:    cyanocobalamin 1000 MCG tablet, Take 1,000 mcg by mouth daily., Disp: , Rfl:    ferrous sulfate  325 (65 FE) MG EC tablet, Take 1 tablet (325 mg total) by mouth 2 (two) times daily., Disp: 60 tablet, Rfl: 3   irbesartan  (AVAPRO ) 300 MG tablet, Take 300 mg by mouth daily., Disp: , Rfl:    metoprolol  succinate (TOPROL -XL) 100 MG 24 hr tablet, TAKE ONE TABLET BY MOUTH ONCE A DAY. TAKE WITH OR IMMEDIATELY FOLLOWING A MEAL, Disp: 90 tablet, Rfl: 3   Multiple Vitamins-Minerals (MULTIVITAMIN ADULT PO), Take by mouth daily., Disp: , Rfl:    omeprazole (PRILOSEC) 40 MG capsule, Take 40 mg by mouth daily., Disp: , Rfl:    OVER THE COUNTER MEDICATION, Apply 1 application  topically as needed (Bil. knee pain; joint pain). RAWLEIGH'S SALVE  Apply to knees/joints daily after shower., Disp: , Rfl:    Potassium 99 MG TABS, Take by mouth daily., Disp: , Rfl:    pravastatin  (PRAVACHOL ) 80 MG tablet, Take 40 mg by mouth daily. Take one-half tablet by mouth daily., Disp: , Rfl:    spironolactone  (ALDACTONE ) 25 MG tablet, Take 1 tablet (25 mg total) by mouth daily., Disp: 30 tablet, Rfl: 1   torsemide  (DEMADEX ) 20 MG tablet, 40 mg in the morning and 20 mg at night, Disp: 270 tablet, Rfl: 3   Subjective:   PATIENT ID: [name] Cart GENDER: female DOB: 06/01/1935, MRN: [id number]  Chief Complaint  Patient presents with   Medical Clearance    Needs clearance for colonoscopy    HPI  Patient is a pleasant 89 year old female presenting to clinic for the evaluation of a right-sided pleural effusion.  Patient feels well today and has no respiratory symptoms.  She is not short of breath and does not have any cough, wheeze, sputum production, fevers, chills, night sweats, or weight changes.  She was recently admitted to the hospital in May 2025 for anemia as well  as decompensated heart failure.  At that time, her BNP was significantly elevated.  CT scan of the abdomen was performed which was incidentally noted for a right-sided pleural effusion.  Given the anemia, she is being considered for colonoscopy.  Patient has followed closely with her primary care physician who recommended a referral to pulmonary for the evaluation of the pleural effusion as well as gastroenterology for further evaluation of GI bleed.  She is followed closely by cardiology, last seen in May 2025.  She has known pulm hypertension secondary to heart failure with a previously estimated PASP of 75 mmHg.  Patient reports a very distant and very brief history of smoking for 2 years and quit 60 years ago.  She worked at [employer] job and has no occupational exposures to report.  [place]  Ancillary information including prior medications, full medical/surgical/family/social histories, and PFTs (when available) are listed below and have been reviewed.   Review of Systems  Constitutional:  Negative for chills and fever.  Respiratory:  Negative for cough, hemoptysis, sputum production, shortness of breath and wheezing.   Cardiovascular:  Negative for chest pain.     Objective:   Vitals:   04/28/24 0821  BP: 104/72  Pulse: 62  Temp: 97.6 F (36.4 C)  TempSrc: Oral  SpO2: 100%  Weight: 164 lb 3.2 oz (74.5 kg)  Height: 5' 8 (1.727 m)   100% on RA BMI Readings from Last 3 Encounters:  04/28/24 24.97 kg/m  03/01/24 23.78 kg/m  02/10/24 28.06 kg/m   Wt Readings from Last 3 Encounters:  04/28/24 164 lb 3.2 oz (74.5 kg)  03/01/24 156 lb 6.4 oz (70.9 kg)  02/10/24 184 lb 8.4 oz (83.7 kg)    Physical Exam Constitutional:      Appearance: Normal appearance.  Cardiovascular:     Rate and Rhythm: Normal rate and regular rhythm.     Pulses: Normal pulses.     Heart sounds: Normal heart sounds.  Pulmonary:     Effort: Pulmonary effort is normal.     Breath sounds:  Normal breath sounds. No wheezing or rales.  Neurological:     General: No focal deficit present.     Mental Status: She is alert and oriented to person, place, and time. Mental status is at baseline.       Ancillary Information    Past Medical History:  Diagnosis Date   Cancer (HCC)    skin   Hyperlipidemia    Hypertension      Family History  Adopted: Yes  Problem Relation Age of Onset   Breast cancer Son        unsure of age   Stroke Mother    Aneurysm Brother    Parkinson's disease Brother      Past Surgical History:  Procedure Laterality Date   ABDOMINAL HYSTERECTOMY     BIOPSY OF SKIN SUBCUTANEOUS TISSUE AND/OR MUCOUS MEMBRANE  02/10/2024   Procedure: BIOPSY, SKIN, SUBCUTANEOUS TISSUE, OR MUCOUS MEMBRANE;  Surgeon: Stacia Glendia BRAVO, MD;  Location: [facility] ENDOSCOPY;  Service: Gastroenterology;;  CHOLECYSTECTOMY     ESOPHAGOGASTRODUODENOSCOPY N/A 02/10/2024   Procedure: EGD (ESOPHAGOGASTRODUODENOSCOPY);  Surgeon: Stacia Glendia BRAVO, MD;  Location: [facility] ENDOSCOPY;  Service: Gastroenterology;  Laterality: N/A;   GALLBLADDER SURGERY      Social History   Socioeconomic History   Marital status: Married    Spouse name: Not on file   Number of children: 2   Years of education: Not on file   Highest education level: Not on file  Occupational History   Not on file  Tobacco Use   Smoking status: Former    Current packs/day: 0.00    Average packs/day: 0.3 packs/day for 5.0 years (1.3 ttl pk-yrs)    Types: Cigarettes    Start date: 94    Quit date: 1964    Years since quitting: 61.6   Smokeless tobacco: Never  Vaping Use   Vaping status: Never Used  Substance and Sexual Activity   Alcohol use: Not Currently   Drug use: Never   Sexual activity: Not on file  Other Topics Concern   Not on file  Social History Narrative   Not on file   Social Drivers of Health   Financial Resource Strain: Not on file  Food Insecurity: No Food Insecurity (02/09/2024)    Hunger Vital Sign    Worried About Running Out of Food in the Last Year: Never true    Ran Out of Food in the Last Year: Never true  Transportation Needs: No Transportation Needs (02/09/2024)   PRAPARE - Administrator, Civil Service (Medical): No    Lack of Transportation (Non-Medical): No  Physical Activity: Not on file  Stress: Not on file  Social Connections: Moderately Isolated (02/09/2024)   Social Connection and Isolation Panel    Frequency of Communication with Friends and Family: More than three times a week    Frequency of Social Gatherings with Friends and Family: More than three times a week    Attends Religious Services: More than 4 times per year    Active Member of [employer] or Organizations: No    Attends Banker Meetings: Never    Marital Status: Widowed  Intimate Partner Violence: Not At Risk (02/09/2024)   Humiliation, Afraid, Rape, and Kick questionnaire    Fear of Current or Ex-Partner: No    Emotionally Abused: No    Physically Abused: No    Sexually Abused: No     Allergies  Allergen Reactions   Shellfish Allergy Anaphylaxis and Rash   Iodinated Contrast Media Rash     CBC    Component Value Date/Time   WBC 4.3 03/01/2024 0847   WBC 5.3 02/11/2024 0449   RBC 4.81 03/01/2024 0847   RBC 4.05 02/11/2024 0449   HGB 11.9 03/01/2024 0847   HCT 39.4 03/01/2024 0847   PLT 331 03/01/2024 0847   MCV 82 03/01/2024 0847   MCH 24.7 (L) 03/01/2024 0847   MCH 21.7 (L) 02/11/2024 0449   MCHC 30.2 (L) 03/01/2024 0847   MCHC 30.0 02/11/2024 0449   RDW 26.9 (H) 03/01/2024 0847   LYMPHSABS 0.7 02/08/2024 1920   MONOABS 0.5 02/08/2024 1920   EOSABS 0.2 02/08/2024 1920   BASOSABS 0.1 02/08/2024 1920    Pulmonary Functions Testing Results:     No data to display          Outpatient Medications Prior to Visit  Medication Sig Dispense Refill   Cholecalciferol (VITAMIN D3) 25 MCG (1000 UT) CAPS Take 1,000 Units by mouth  daily.      cyanocobalamin 1000 MCG tablet Take 1,000 mcg by mouth daily.     ferrous sulfate  325 (65 FE) MG EC tablet Take 1 tablet (325 mg total) by mouth 2 (two) times daily. 60 tablet 3   irbesartan  (AVAPRO ) 300 MG tablet Take 300 mg by mouth daily.     metoprolol  succinate (TOPROL -XL) 100 MG 24 hr tablet TAKE ONE TABLET BY MOUTH ONCE A DAY. TAKE WITH OR IMMEDIATELY FOLLOWING A MEAL 90 tablet 3   Multiple Vitamins-Minerals (MULTIVITAMIN ADULT PO) Take by mouth daily.     omeprazole (PRILOSEC) 40 MG capsule Take 40 mg by mouth daily.     OVER THE COUNTER MEDICATION Apply 1 application  topically as needed (Bil. knee pain; joint pain). RAWLEIGH'S SALVE  Apply to knees/joints daily after shower.     Potassium 99 MG TABS Take by mouth daily.     pravastatin  (PRAVACHOL ) 80 MG tablet Take 40 mg by mouth daily. Take one-half tablet by mouth daily.     spironolactone  (ALDACTONE ) 25 MG tablet Take 1 tablet (25 mg total) by mouth daily. 30 tablet 1   torsemide  (DEMADEX ) 20 MG tablet 40 mg in the morning and 20 mg at night 270 tablet 3   No facility-administered medications prior to visit.

## 2024-05-03 ENCOUNTER — Telehealth: Payer: Self-pay | Admitting: *Deleted

## 2024-05-03 NOTE — Telephone Encounter (Signed)
 Request for surgical clearance:     Endoscopy Procedure  What type of surgery is being performed?     colonoscopy  When is this surgery scheduled?     TBD  What type of clearance is required ?   Medical/Cardiac  Are there any medications that need to be held prior to surgery and how long? NO  Practice name and name of physician performing surgery?      [facility]  What is your office phone and fax number?      Phone- [phone]  Fax- [phone]  Anesthesia type (None, local, MAC, general) ?       MAC  Please route your response to Naomie Sharps, RN  Thanks!

## 2024-05-04 NOTE — Telephone Encounter (Signed)
   Name: [name]  DOB: 1934-10-18  MRN: [id number]  Primary Cardiologist: Newman JINNY Lawrence, MD   Preoperative team, please contact this patient and set up a phone call appointment for further preoperative risk assessment. Please obtain consent and complete medication review. Thank you for your help.  I confirm that guidance regarding antiplatelet and oral anticoagulation therapy has been completed and, if necessary, noted below (none requested).   I also confirmed the patient resides in the state of [place] . As per [facility] Medical Board telemedicine laws, the patient must reside in the state in which the provider is licensed.   Damien JAYSON Braver, NP 05/04/2024, 4:19 PM [facility]

## 2024-05-05 NOTE — Telephone Encounter (Signed)
 1st attempt to reach pt regarding surgical clearance and the need for an TELE appointment.  Left pt a detailed message to call back and get that scheduled.

## 2024-05-08 NOTE — Telephone Encounter (Signed)
 Spoke with the patient to schedule cardiac preop tele appt, patient states that she is no longer having a colonoscopy.

## 2024-05-12 NOTE — Telephone Encounter (Signed)
===  View-only below this line=== ----- Message ----- From: Stacia Glendia BRAVO, MD Sent: 05/11/2024   3:36 PM EDT To: Naomie LOISE Sharps, RN  Ok thank you.  If patient changes her mind, she will need cardiology clearance prior to an elective colonoscopy. ----- Message ----- From: Sharps Naomie LOISE, RN Sent: 05/11/2024   8:12 AM EDT To: Glendia BRAVO Stacia, MD  Just an FYI-  See 02/21/24 phone note. Patient planned to have colonoscopy for workup of IDA. Was given pulmonary clearance. Cardiac clearance was recommended before proceeding with colonoscopy. Cardiology contacted patient for appointment but patient has now decided against colonoscopy. ----- Message -----

## 2024-06-07 DIAGNOSIS — E785 Hyperlipidemia, unspecified: Secondary | ICD-10-CM | POA: Diagnosis not present

## 2024-06-07 DIAGNOSIS — D500 Iron deficiency anemia secondary to blood loss (chronic): Secondary | ICD-10-CM | POA: Diagnosis not present

## 2024-06-07 LAB — LAB REPORT - SCANNED: EGFR: 30

## 2024-06-09 ENCOUNTER — Encounter: Payer: Self-pay | Admitting: Cardiology

## 2024-06-09 ENCOUNTER — Ambulatory Visit: Attending: Cardiology | Admitting: Cardiology

## 2024-06-09 VITALS — BP 138/70 | HR 55 | Ht 68.0 in | Wt 176.0 lb

## 2024-06-09 DIAGNOSIS — I4819 Other persistent atrial fibrillation: Secondary | ICD-10-CM

## 2024-06-09 DIAGNOSIS — I361 Nonrheumatic tricuspid (valve) insufficiency: Secondary | ICD-10-CM | POA: Diagnosis not present

## 2024-06-09 NOTE — Patient Instructions (Addendum)
  Testing/Procedures: Echocardiogram  Your physician has requested that you have an echocardiogram. Echocardiography is a painless test that uses sound waves to create images of your heart. It provides your doctor with information about the size and shape of your heart and how well your heart's chambers and valves are working. This procedure takes approximately one hour. There are no restrictions for this procedure. Please do NOT wear cologne, perfume, aftershave, or lotions (deodorant is allowed). Please arrive 15 minutes prior to your appointment time.  Please note: We ask at that you not bring children with you during ultrasound (echo/ vascular) testing. Due to room size and safety concerns, children are not allowed in the ultrasound rooms during exams. Our front office staff cannot provide observation of children in our lobby area while testing is being conducted. An adult accompanying a patient to their appointment will only be allowed in the ultrasound room at the discretion of the ultrasound technician under special circumstances. We apologize for any inconvenience.   Follow-Up: At [facility], you and your health needs are our priority.  As part of our continuing mission to provide you with exceptional heart care, our providers are all part of one team.  This team includes your primary Cardiologist (physician) and Advanced Practice Providers or APPs (Physician Assistants and Nurse Practitioners) who all work together to provide you with the care you need, when you need it.  Your next appointment:   6 month(s) (same day as echocardiogram)  Provider:   Newman JINNY Lawrence, MD    We recommend signing up for the patient portal called MyChart.  Sign up information is provided on this After Visit Summary.  MyChart is used to connect with patients for Virtual Visits (Telemedicine).  Patients are able to view lab/test results, encounter notes, upcoming appointments, etc.  Non-urgent  messages can be sent to your provider as well.   To learn more about what you can do with MyChart, go to [email or web address].

## 2024-06-09 NOTE — Progress Notes (Signed)
 Cardiology Office Note:  .   Date:  06/09/2024  ID:  Heron JINNY Cart, DOB 07-Nov-1934, MRN [id number] PCP: Clarice Nottingham, MD  [facility] Providers Cardiologist:  Newman Lawrence, MD PCP: Clarice Nottingham, MD  Chief Complaint  Patient presents with   Anemia   pulmonary HTN   Severe TR      History of Present Illness: .    [name] is a 89 y.o. female with hypertension, hyperlipidemia, persistent atrial fibrillation, h/o GI bleed-thus not on anticoagulation, MR, TR, pulmonary hypertension  Patient is doing well.  Leg edema is completely resolved.  She is still able to get around and do her daily activities.  Knee pain is controllable.  She has not had any recent bleeding issues.  She had blood work with her PCP recently, results not available to me.  She has a follow-up again with them soon.  Vitals:   06/09/24 0846  BP: 138/70  Pulse: (!) 55  SpO2: 100%       ROS:  Review of Systems  Cardiovascular:  Negative for chest pain, dyspnea on exertion, leg swelling, palpitations and syncope.     Studies Reviewed: SABRA        EKG 01/28/2024: Atrial fibrillation 67 bpm Low voltage QRS Septal infarct , age undetermined When compared with ECG of 05-Feb-2018 17:39, Atrial fibrillation has replaced Sinus rhythm Septal infarct is now Present Nonspecific T wave abnormality now evident in Anterior leads  Labs 01/2024: Hb 8.8 Cr 1.19, eGFR 45, K 3.3 Total protein 5.8, T.bilirubin 2.6 BNP 521  12/2022: Chol 129, TG 72, HDL 56, LDL 58 Cr 1.0 TSH 4.9  Echocardiogram 01/2024:  1. Left ventricular ejection fraction, by estimation, is 60 to 65%. The  left ventricle has normal function. The left ventricle has no regional  wall motion abnormalities. Left ventricular diastolic function could not  be evaluated.   2. Right ventricular systolic function is normal. The right ventricular  size is moderately enlarged. There is severely elevated pulmonary artery  systolic  pressure with estimated right ventricular systolic pressure is 66.0 mmHg.   3. Left atrial size was severely dilated.   4. Right atrial size was severely dilated.   5. The mitral valve is normal in structure. Trivial mitral valve  regurgitation. No evidence of mitral stenosis. Moderate mitral annular  calcification.   6. Tricuspid valve regurgitation is severe.   7. The aortic valve is tricuspid. There is moderate calcification of the  aortic valve. There is moderate thickening of the aortic valve. Aortic  valve regurgitation is trivial. Aortic valve sclerosis/calcification is  present, without any evidence of  aortic stenosis. Aortic valve area, by VTI measures 1.15 cm. Aortic valve  mean gradient measures 9.0 mmHg. Aortic valve Vmax measures 2.07 m/s.   8. The inferior vena cava is normal in size with greater than 50%  respiratory variability, suggesting right atrial pressure of 3 mmHg.    Risk Assessment/Calculations:    CHA2DS2-VASc Score = 4  This indicates a 4.8% annual risk of stroke. The patient's score is based upon: CHF History: 0 HTN History: 1 Diabetes History: 0 Stroke History: 0 Vascular Disease History: 0 Age Score: 2 Gender Score: 1    Physical Exam:   Physical Exam Vitals and nursing note reviewed.  Constitutional:      General: She is not in acute distress. Neck:     Vascular: No JVD.  Cardiovascular:     Rate and Rhythm: Normal rate. Rhythm irregular.  Heart sounds: Murmur heard.     High-pitched blowing holosystolic murmur is present with a grade of 2/6 at the lower right sternal border.  Pulmonary:     Effort: Pulmonary effort is normal.     Breath sounds: Normal breath sounds. No wheezing or rales.  Musculoskeletal:     Right lower leg: No edema.     Left lower leg: No edema.      VISIT DIAGNOSES:   ICD-10-CM   1. Nonrheumatic tricuspid valve regurgitation  I36.1 ECHOCARDIOGRAM COMPLETE    CANCELED: ECHOCARDIOGRAM COMPLETE    2.  Persistent atrial fibrillation (HCC)  I48.19           ASSESSMENT AND PLAN: .    [name] is a 89 y.o. female with hypertension, hyperlipidemia, persistent atrial fibrillation, h/o GI bleed-thus not on anticoagulation, MR, TR, pulmonary hypertension  Severe TR, pulmonary hypertension: Secondary to longstanding A-fib.  Fortunately, with diuretics, her leg edema has resolved.  Continue spironolactone  25 mg daily, torsemide  40 mg in a.m., 20 mg in p.m. If potassium is normal, she may not need potassium supplements.  Defer to PCP, as I do not have imaging results available.  Blood loss anemia: No recent bleeding.  Continue follow-up with gastroenterology.  Persistent atrial fibrillation: Rate controlled. Not on Xarelto  due to recurrent GI bleeding. She has opted not to consider Watchman device procedure. Patient understands risks of stroke, but is , but she remains reluctant to try anticoagulation, or consider left atrial Penders closure.    F/u in 6 months  Signed, Newman JINNY Lawrence, MD

## 2024-06-14 DIAGNOSIS — E559 Vitamin D deficiency, unspecified: Secondary | ICD-10-CM | POA: Diagnosis not present

## 2024-06-14 DIAGNOSIS — K219 Gastro-esophageal reflux disease without esophagitis: Secondary | ICD-10-CM | POA: Diagnosis not present

## 2024-06-14 DIAGNOSIS — I4891 Unspecified atrial fibrillation: Secondary | ICD-10-CM | POA: Diagnosis not present

## 2024-06-14 DIAGNOSIS — J90 Pleural effusion, not elsewhere classified: Secondary | ICD-10-CM | POA: Diagnosis not present

## 2024-06-14 DIAGNOSIS — D500 Iron deficiency anemia secondary to blood loss (chronic): Secondary | ICD-10-CM | POA: Diagnosis not present

## 2024-06-14 DIAGNOSIS — I5032 Chronic diastolic (congestive) heart failure: Secondary | ICD-10-CM | POA: Diagnosis not present

## 2024-06-14 DIAGNOSIS — I10 Essential (primary) hypertension: Secondary | ICD-10-CM | POA: Diagnosis not present

## 2024-06-15 DIAGNOSIS — D500 Iron deficiency anemia secondary to blood loss (chronic): Secondary | ICD-10-CM | POA: Diagnosis not present

## 2024-06-25 DIAGNOSIS — T23222A Burn of second degree of single left finger (nail) except thumb, initial encounter: Secondary | ICD-10-CM | POA: Diagnosis not present

## 2024-06-25 DIAGNOSIS — X131XXA Other contact with steam and other hot vapors, initial encounter: Secondary | ICD-10-CM | POA: Diagnosis not present

## 2024-06-25 DIAGNOSIS — L03012 Cellulitis of left finger: Secondary | ICD-10-CM | POA: Diagnosis not present

## 2024-08-03 DIAGNOSIS — I4891 Unspecified atrial fibrillation: Secondary | ICD-10-CM | POA: Diagnosis not present

## 2024-08-03 DIAGNOSIS — D500 Iron deficiency anemia secondary to blood loss (chronic): Secondary | ICD-10-CM | POA: Diagnosis not present

## 2024-08-03 DIAGNOSIS — Z23 Encounter for immunization: Secondary | ICD-10-CM | POA: Diagnosis not present

## 2024-08-03 DIAGNOSIS — R7989 Other specified abnormal findings of blood chemistry: Secondary | ICD-10-CM | POA: Diagnosis not present

## 2024-08-03 DIAGNOSIS — I5032 Chronic diastolic (congestive) heart failure: Secondary | ICD-10-CM | POA: Diagnosis not present

## 2024-12-05 ENCOUNTER — Ambulatory Visit (HOSPITAL_COMMUNITY)

## 2024-12-05 ENCOUNTER — Ambulatory Visit: Admitting: Cardiology
